# Patient Record
Sex: Female | Born: 1965 | Race: White | Hispanic: No | Marital: Married | Smoking: Former smoker
Health system: Southern US, Community
[De-identification: ages and names within clinical notes are randomized; demographics above are authoritative.]

## PROBLEM LIST (undated history)

## (undated) DIAGNOSIS — C50919 Malignant neoplasm of unspecified site of unspecified female breast: Secondary | ICD-10-CM

## (undated) DIAGNOSIS — Z923 Personal history of irradiation: Secondary | ICD-10-CM

## (undated) DIAGNOSIS — Z87891 Personal history of nicotine dependence: Secondary | ICD-10-CM

## (undated) DIAGNOSIS — M5126 Other intervertebral disc displacement, lumbar region: Secondary | ICD-10-CM

## (undated) DIAGNOSIS — I10 Essential (primary) hypertension: Secondary | ICD-10-CM

## (undated) DIAGNOSIS — K219 Gastro-esophageal reflux disease without esophagitis: Secondary | ICD-10-CM

## (undated) DIAGNOSIS — R000 Tachycardia, unspecified: Secondary | ICD-10-CM

## (undated) DIAGNOSIS — E785 Hyperlipidemia, unspecified: Secondary | ICD-10-CM

## (undated) DIAGNOSIS — Z8739 Personal history of other diseases of the musculoskeletal system and connective tissue: Secondary | ICD-10-CM

## (undated) DIAGNOSIS — Z9221 Personal history of antineoplastic chemotherapy: Secondary | ICD-10-CM

## (undated) DIAGNOSIS — Z1371 Encounter for nonprocreative screening for genetic disease carrier status: Secondary | ICD-10-CM

## (undated) DIAGNOSIS — G43909 Migraine, unspecified, not intractable, without status migrainosus: Secondary | ICD-10-CM

## (undated) DIAGNOSIS — Z1211 Encounter for screening for malignant neoplasm of colon: Secondary | ICD-10-CM

## (undated) HISTORY — PX: ABDOMINAL HYSTERECTOMY: SHX81

## (undated) HISTORY — PX: LAPAROSCOPIC TOTAL HYSTERECTOMY: SUR800

## (undated) HISTORY — DX: Malignant neoplasm of unspecified site of unspecified female breast: C50.919

## (undated) HISTORY — DX: Encounter for screening for malignant neoplasm of colon: Z12.11

## (undated) HISTORY — DX: Encounter for nonprocreative screening for genetic disease carrier status: Z13.71

## (undated) HISTORY — DX: Other intervertebral disc displacement, lumbar region: M51.26

## (undated) HISTORY — DX: Personal history of nicotine dependence: Z87.891

## (undated) HISTORY — DX: Essential (primary) hypertension: I10

## (undated) HISTORY — DX: Migraine, unspecified, not intractable, without status migrainosus: G43.909

## (undated) HISTORY — DX: Hyperlipidemia, unspecified: E78.5

## (undated) HISTORY — DX: Gastro-esophageal reflux disease without esophagitis: K21.9

## (undated) HISTORY — DX: Personal history of other diseases of the musculoskeletal system and connective tissue: Z87.39

## (undated) HISTORY — DX: Tachycardia, unspecified: R00.0

## (undated) HISTORY — PX: TONSILLECTOMY: SHX5217

---

## 1998-03-09 ENCOUNTER — Other Ambulatory Visit: Admission: RE | Admit: 1998-03-09 | Discharge: 1998-03-09 | Payer: Self-pay | Admitting: *Deleted

## 2005-01-13 DIAGNOSIS — C50919 Malignant neoplasm of unspecified site of unspecified female breast: Secondary | ICD-10-CM

## 2005-01-13 HISTORY — DX: Malignant neoplasm of unspecified site of unspecified female breast: C50.919

## 2005-01-13 HISTORY — PX: BREAST BIOPSY: SHX20

## 2005-06-26 ENCOUNTER — Ambulatory Visit: Payer: Self-pay | Admitting: Oncology

## 2005-06-27 ENCOUNTER — Ambulatory Visit: Payer: Self-pay | Admitting: Oncology

## 2005-07-13 ENCOUNTER — Ambulatory Visit: Payer: Self-pay | Admitting: Oncology

## 2005-08-13 ENCOUNTER — Ambulatory Visit: Payer: Self-pay | Admitting: Oncology

## 2005-09-13 ENCOUNTER — Ambulatory Visit: Payer: Self-pay | Admitting: Oncology

## 2005-10-13 ENCOUNTER — Ambulatory Visit: Payer: Self-pay | Admitting: Oncology

## 2005-11-13 ENCOUNTER — Ambulatory Visit: Payer: Self-pay | Admitting: Oncology

## 2005-11-14 ENCOUNTER — Ambulatory Visit: Payer: Self-pay | Admitting: Oncology

## 2005-12-13 ENCOUNTER — Ambulatory Visit: Payer: Self-pay | Admitting: Oncology

## 2005-12-13 HISTORY — PX: MASTECTOMY: SHX3

## 2005-12-19 ENCOUNTER — Ambulatory Visit: Payer: Self-pay | Admitting: General Surgery

## 2006-01-13 ENCOUNTER — Ambulatory Visit: Payer: Self-pay | Admitting: Oncology

## 2006-02-13 ENCOUNTER — Ambulatory Visit: Payer: Self-pay | Admitting: Oncology

## 2006-02-25 ENCOUNTER — Ambulatory Visit: Payer: Self-pay | Admitting: General Surgery

## 2006-03-14 ENCOUNTER — Ambulatory Visit: Payer: Self-pay | Admitting: Oncology

## 2006-03-14 ENCOUNTER — Ambulatory Visit: Payer: Self-pay | Admitting: Radiation Oncology

## 2006-04-14 ENCOUNTER — Ambulatory Visit: Payer: Self-pay | Admitting: Unknown Physician Specialty

## 2006-04-14 ENCOUNTER — Ambulatory Visit: Payer: Self-pay | Admitting: Oncology

## 2006-04-14 ENCOUNTER — Ambulatory Visit: Payer: Self-pay | Admitting: Radiation Oncology

## 2006-04-21 ENCOUNTER — Ambulatory Visit: Payer: Self-pay | Admitting: Unknown Physician Specialty

## 2006-05-14 ENCOUNTER — Ambulatory Visit: Payer: Self-pay | Admitting: Radiation Oncology

## 2006-05-14 ENCOUNTER — Ambulatory Visit: Payer: Self-pay | Admitting: Oncology

## 2006-06-14 ENCOUNTER — Ambulatory Visit: Payer: Self-pay | Admitting: Radiation Oncology

## 2006-06-14 ENCOUNTER — Ambulatory Visit: Payer: Self-pay | Admitting: Oncology

## 2006-07-14 ENCOUNTER — Ambulatory Visit: Payer: Self-pay | Admitting: Oncology

## 2006-07-14 ENCOUNTER — Ambulatory Visit: Payer: Self-pay | Admitting: Radiation Oncology

## 2006-08-14 ENCOUNTER — Ambulatory Visit: Payer: Self-pay | Admitting: Oncology

## 2006-08-14 ENCOUNTER — Ambulatory Visit: Payer: Self-pay | Admitting: Radiation Oncology

## 2006-08-17 ENCOUNTER — Ambulatory Visit: Payer: Self-pay | Admitting: General Surgery

## 2006-09-14 ENCOUNTER — Ambulatory Visit: Payer: Self-pay | Admitting: Radiation Oncology

## 2006-09-14 ENCOUNTER — Ambulatory Visit: Payer: Self-pay | Admitting: Oncology

## 2006-10-14 ENCOUNTER — Ambulatory Visit: Payer: Self-pay | Admitting: Oncology

## 2006-11-02 ENCOUNTER — Ambulatory Visit: Payer: Self-pay | Admitting: Oncology

## 2006-11-14 ENCOUNTER — Ambulatory Visit: Payer: Self-pay | Admitting: Oncology

## 2007-01-14 ENCOUNTER — Ambulatory Visit: Payer: Self-pay | Admitting: Oncology

## 2007-01-14 HISTORY — PX: COLONOSCOPY: SHX174

## 2007-01-19 ENCOUNTER — Ambulatory Visit: Payer: Self-pay | Admitting: Oncology

## 2007-01-27 ENCOUNTER — Ambulatory Visit: Payer: Self-pay | Admitting: General Surgery

## 2007-02-14 ENCOUNTER — Ambulatory Visit: Payer: Self-pay | Admitting: Oncology

## 2007-03-14 ENCOUNTER — Ambulatory Visit: Payer: Self-pay | Admitting: Oncology

## 2007-04-14 ENCOUNTER — Ambulatory Visit: Payer: Self-pay | Admitting: Oncology

## 2007-05-13 ENCOUNTER — Ambulatory Visit: Payer: Self-pay | Admitting: Oncology

## 2007-05-14 ENCOUNTER — Ambulatory Visit: Payer: Self-pay | Admitting: Oncology

## 2007-07-14 ENCOUNTER — Ambulatory Visit: Payer: Self-pay | Admitting: Oncology

## 2007-08-05 ENCOUNTER — Ambulatory Visit: Payer: Self-pay | Admitting: Oncology

## 2007-08-14 ENCOUNTER — Ambulatory Visit: Payer: Self-pay | Admitting: Oncology

## 2007-08-17 ENCOUNTER — Encounter: Payer: Self-pay | Admitting: Oncology

## 2007-09-14 ENCOUNTER — Ambulatory Visit: Payer: Self-pay | Admitting: Oncology

## 2007-09-14 ENCOUNTER — Encounter: Payer: Self-pay | Admitting: Oncology

## 2007-10-14 ENCOUNTER — Encounter: Payer: Self-pay | Admitting: Oncology

## 2007-11-05 ENCOUNTER — Ambulatory Visit: Payer: Self-pay | Admitting: Oncology

## 2007-11-14 ENCOUNTER — Ambulatory Visit: Payer: Self-pay | Admitting: Oncology

## 2007-11-14 ENCOUNTER — Encounter: Payer: Self-pay | Admitting: Oncology

## 2007-12-14 ENCOUNTER — Ambulatory Visit: Payer: Self-pay | Admitting: Oncology

## 2008-01-10 ENCOUNTER — Ambulatory Visit: Payer: Self-pay | Admitting: Unknown Physician Specialty

## 2008-02-14 ENCOUNTER — Ambulatory Visit: Payer: Self-pay | Admitting: Oncology

## 2008-02-17 ENCOUNTER — Ambulatory Visit: Payer: Self-pay | Admitting: Oncology

## 2008-02-24 ENCOUNTER — Ambulatory Visit: Payer: Self-pay | Admitting: Oncology

## 2008-03-13 ENCOUNTER — Ambulatory Visit: Payer: Self-pay | Admitting: Oncology

## 2008-04-13 ENCOUNTER — Ambulatory Visit: Payer: Self-pay | Admitting: Oncology

## 2008-05-13 ENCOUNTER — Ambulatory Visit: Payer: Self-pay | Admitting: Oncology

## 2008-06-08 ENCOUNTER — Ambulatory Visit: Payer: Self-pay | Admitting: Oncology

## 2008-06-13 ENCOUNTER — Ambulatory Visit: Payer: Self-pay | Admitting: Oncology

## 2008-08-17 ENCOUNTER — Ambulatory Visit: Payer: Self-pay | Admitting: General Surgery

## 2008-09-13 ENCOUNTER — Ambulatory Visit: Payer: Self-pay | Admitting: Oncology

## 2008-10-10 ENCOUNTER — Ambulatory Visit: Payer: Self-pay | Admitting: Oncology

## 2008-10-13 ENCOUNTER — Ambulatory Visit: Payer: Self-pay | Admitting: Oncology

## 2008-12-13 ENCOUNTER — Ambulatory Visit: Payer: Self-pay | Admitting: Oncology

## 2008-12-27 ENCOUNTER — Ambulatory Visit: Payer: Self-pay | Admitting: Oncology

## 2009-01-13 ENCOUNTER — Ambulatory Visit: Payer: Self-pay | Admitting: Oncology

## 2009-01-13 DIAGNOSIS — I10 Essential (primary) hypertension: Secondary | ICD-10-CM

## 2009-01-13 HISTORY — DX: Essential (primary) hypertension: I10

## 2009-03-13 ENCOUNTER — Ambulatory Visit: Payer: Self-pay | Admitting: Oncology

## 2009-03-23 ENCOUNTER — Ambulatory Visit: Payer: Self-pay | Admitting: Oncology

## 2009-04-13 ENCOUNTER — Ambulatory Visit: Payer: Self-pay | Admitting: Oncology

## 2009-06-13 ENCOUNTER — Ambulatory Visit: Payer: Self-pay | Admitting: Oncology

## 2009-06-18 ENCOUNTER — Ambulatory Visit: Payer: Self-pay | Admitting: Oncology

## 2009-07-02 ENCOUNTER — Emergency Department: Payer: Self-pay | Admitting: Emergency Medicine

## 2009-07-13 ENCOUNTER — Ambulatory Visit: Payer: Self-pay | Admitting: Oncology

## 2009-08-30 ENCOUNTER — Ambulatory Visit: Payer: Self-pay | Admitting: General Surgery

## 2009-09-11 ENCOUNTER — Ambulatory Visit: Payer: Self-pay | Admitting: Oncology

## 2009-09-12 LAB — CANCER ANTIGEN 27.29: CA 27.29: 6.5 U/mL (ref 0.0–38.6)

## 2009-09-13 ENCOUNTER — Ambulatory Visit: Payer: Self-pay | Admitting: Oncology

## 2009-10-13 ENCOUNTER — Ambulatory Visit: Payer: Self-pay | Admitting: Oncology

## 2009-12-13 ENCOUNTER — Ambulatory Visit: Payer: Self-pay | Admitting: Oncology

## 2009-12-28 ENCOUNTER — Encounter
Admission: RE | Admit: 2009-12-28 | Discharge: 2009-12-28 | Payer: Self-pay | Source: Home / Self Care | Attending: Oncology | Admitting: Oncology

## 2010-01-13 ENCOUNTER — Ambulatory Visit: Payer: Self-pay | Admitting: Oncology

## 2010-01-13 DIAGNOSIS — K219 Gastro-esophageal reflux disease without esophagitis: Secondary | ICD-10-CM

## 2010-01-13 DIAGNOSIS — Z8739 Personal history of other diseases of the musculoskeletal system and connective tissue: Secondary | ICD-10-CM

## 2010-01-13 HISTORY — DX: Personal history of other diseases of the musculoskeletal system and connective tissue: Z87.39

## 2010-01-13 HISTORY — DX: Gastro-esophageal reflux disease without esophagitis: K21.9

## 2010-03-12 ENCOUNTER — Ambulatory Visit: Payer: Self-pay | Admitting: Oncology

## 2010-03-14 ENCOUNTER — Ambulatory Visit: Payer: Self-pay | Admitting: Oncology

## 2010-04-14 ENCOUNTER — Ambulatory Visit: Payer: Self-pay | Admitting: Oncology

## 2010-04-25 ENCOUNTER — Ambulatory Visit: Payer: Self-pay | Admitting: Cardiovascular Disease

## 2010-07-10 ENCOUNTER — Encounter: Payer: Self-pay | Admitting: Oncology

## 2010-07-14 ENCOUNTER — Encounter: Payer: Self-pay | Admitting: Oncology

## 2010-08-14 ENCOUNTER — Encounter: Payer: Self-pay | Admitting: Oncology

## 2010-08-19 ENCOUNTER — Ambulatory Visit: Payer: Self-pay | Admitting: Oncology

## 2010-09-10 ENCOUNTER — Ambulatory Visit: Payer: Self-pay | Admitting: General Surgery

## 2010-09-14 ENCOUNTER — Ambulatory Visit: Payer: Self-pay | Admitting: Oncology

## 2010-10-14 ENCOUNTER — Ambulatory Visit: Payer: Self-pay | Admitting: Oncology

## 2010-11-14 ENCOUNTER — Ambulatory Visit: Payer: Self-pay | Admitting: Oncology

## 2010-12-14 ENCOUNTER — Ambulatory Visit: Payer: Self-pay | Admitting: Oncology

## 2011-02-17 ENCOUNTER — Ambulatory Visit: Payer: Self-pay | Admitting: Oncology

## 2011-02-17 LAB — COMPREHENSIVE METABOLIC PANEL
Albumin: 4.3 g/dL (ref 3.4–5.0)
Anion Gap: 6 — ABNORMAL LOW (ref 7–16)
BUN: 9 mg/dL (ref 7–18)
Bilirubin,Total: 0.3 mg/dL (ref 0.2–1.0)
Glucose: 90 mg/dL (ref 65–99)
Osmolality: 276 (ref 275–301)
Potassium: 4.5 mmol/L (ref 3.5–5.1)
Sodium: 139 mmol/L (ref 136–145)
Total Protein: 8.2 g/dL (ref 6.4–8.2)

## 2011-02-17 LAB — CBC CANCER CENTER
Basophil %: 0.4 %
Eosinophil #: 0.1 x10 3/mm (ref 0.0–0.7)
Lymphocyte %: 39.5 %
MCHC: 34.1 g/dL (ref 32.0–36.0)
Neutrophil %: 50.4 %

## 2011-03-14 ENCOUNTER — Ambulatory Visit: Payer: Self-pay | Admitting: Oncology

## 2011-04-03 ENCOUNTER — Ambulatory Visit (INDEPENDENT_AMBULATORY_CARE_PROVIDER_SITE_OTHER): Payer: Medicare Other | Admitting: Cardiovascular Disease

## 2011-04-03 ENCOUNTER — Encounter: Payer: Self-pay | Admitting: *Deleted

## 2011-04-03 ENCOUNTER — Encounter: Payer: Self-pay | Admitting: Cardiovascular Disease

## 2011-04-03 VITALS — BP 124/83 | HR 90 | Ht 66.0 in | Wt 154.0 lb

## 2011-04-03 DIAGNOSIS — E7849 Other hyperlipidemia: Secondary | ICD-10-CM | POA: Insufficient documentation

## 2011-04-03 DIAGNOSIS — R002 Palpitations: Secondary | ICD-10-CM

## 2011-04-03 DIAGNOSIS — I10 Essential (primary) hypertension: Secondary | ICD-10-CM | POA: Insufficient documentation

## 2011-04-03 DIAGNOSIS — R000 Tachycardia, unspecified: Secondary | ICD-10-CM | POA: Insufficient documentation

## 2011-04-03 DIAGNOSIS — E785 Hyperlipidemia, unspecified: Secondary | ICD-10-CM

## 2011-04-03 NOTE — Assessment & Plan Note (Signed)
She does report episodes of palpitations and elevated heart rate. This is not regular it happens occasionally. We have given her samples of low-dose bystolic to take as needed for symptom relief.

## 2011-04-03 NOTE — Assessment & Plan Note (Signed)
By her report and in clinic today, blood pressure is well controlled. She is only on a very low dose amlodipine. She would like to discontinue the medication if possible. We have suggested she hold the medication and closely monitor her blood pressure. She'll call our office if her blood pressure increases. If it does rise, we could alternatively start low-dose beta blocker given her palpitations and tachycardia symptoms.

## 2011-04-03 NOTE — Patient Instructions (Signed)
You are doing well. Please hold the amlodipine Take the bystolic 5 mg as needed for palpitations.  Please call us if you have new issues that need to be addressed before your next appt.

## 2011-04-03 NOTE — Progress Notes (Signed)
Patient ID: Barbara Fleming, female    DOB: 10-13-1965, 46 y.o.   MRN: [id number]  HPI Comments: Barbara Fleming is a pleasant 46 year old woman with history of breast cancer on the right, history of chemotherapy and radiation, reported hypertension, hyperlipidemia,  remote history of smoking for 10 years who stopped many years ago, presenting for second opinion regarding hypertension, palpitations and tachycardia. Notes indicate previous history of GERD  And low vitamin D  She reports that she was initially diagnosed with hypertension after an episode of neurologic type symptoms following a chiropractic adjustment several years ago. Blood pressure was 190 systolic and she presented to the emergency room and was started on 2 medications. Since then her blood pressure medications have been weaned and she takes amlodipine 2.5 mg daily. She reports her blood pressure has been well-controlled, typically in the 120 range systolic. She denies any significant chest pain or shortness of breath.   She has noticed slightly higher heart rate since her chemotherapy and radiation. More symptoms of palpitations in general. Though she does report also rates in the 70s sometimes before she goes to bed.  She has had problems in the past with TMJ. She currently sees Dr. Deloria Lair, chiropractor in [place] for TMJ and chronic neck and back discomfort.  Lab work from 2011 shows total cholesterol 205, LDL 134, HDL 59 Stress test August 2012 shows normal LV function on echocardiogram at stress and rest, ejection fraction 61% Echo April 2012 is essentially normal  EKG shows normal sinus rhythm with rate 94 beats a minute with no significant ST or T wave changes        Outpatient Encounter Prescriptions as of 04/03/2011  Medication Sig Dispense Refill  . amLODipine (NORVASC) 5 MG tablet Take 2.5 mg by mouth. 1-2 times daily as directed by doctor       . cyclobenzaprine (FLEXERIL) 5 MG tablet Take 1-2 tablets daily by  mouth three times a day as needed      . tamoxifen (NOLVADEX) 20 MG tablet Take 20 mg by mouth daily.      Marland Kitchen DISCONTD: citalopram (CELEXA) 10 MG tablet Take 10 mg by mouth daily.        Review of Systems  Constitutional: Negative.   HENT: Negative.   Eyes: Negative.   Respiratory: Negative.   Cardiovascular: Positive for palpitations.  Gastrointestinal: Negative.   Musculoskeletal: Negative.   Skin: Negative.   Neurological: Negative.   Hematological: Negative.   Psychiatric/Behavioral: Negative.   All other systems reviewed and are negative.    BP 124/83  Pulse 90  Ht 5\' 6"  (1.676 m)  Wt 154 lb (69.854 kg)  BMI 24.86 kg/m2  Physical Exam  Nursing note and vitals reviewed. Constitutional: She is oriented to person, place, and time. She appears well-developed and well-nourished.  HENT:  Head: Normocephalic.  Nose: Nose normal.  Mouth/Throat: Oropharynx is clear and moist.  Eyes: Conjunctivae are normal. Pupils are equal, round, and reactive to light.  Neck: Normal range of motion. Neck supple. No JVD present.  Cardiovascular: Normal rate, regular rhythm, S1 normal, S2 normal, normal heart sounds and intact distal pulses.  Exam reveals no gallop and no friction rub.   No murmur heard. Pulmonary/Chest: Effort normal and breath sounds normal. No respiratory distress. She has no wheezes. She has no rales. She exhibits no tenderness.  Abdominal: Soft. Bowel sounds are normal. She exhibits no distension. There is no tenderness.  Musculoskeletal: Normal range of motion. She exhibits no edema  and no tenderness.  Lymphadenopathy:    She has no cervical adenopathy.  Neurological: She is alert and oriented to person, place, and time. Coordination normal.  Skin: Skin is warm and dry. No rash noted. No erythema.  Psychiatric: She has a normal mood and affect. Her behavior is normal. Judgment and thought content normal.         Assessment and Plan

## 2011-04-03 NOTE — Assessment & Plan Note (Signed)
Cholesterol is mildly elevated, likely genetic. We have suggested she could try red yeast rice she does not want a prescription for cholesterol medication. Otherwise we have suggested she closely monitor her diet and increase her exercise.

## 2011-04-14 ENCOUNTER — Ambulatory Visit: Payer: Self-pay | Admitting: Oncology

## 2011-04-21 ENCOUNTER — Ambulatory Visit: Payer: Self-pay | Admitting: Gastroenterology

## 2011-04-24 LAB — PATHOLOGY REPORT

## 2011-05-09 ENCOUNTER — Encounter: Payer: Self-pay | Admitting: Cardiovascular Disease

## 2011-07-22 ENCOUNTER — Ambulatory Visit: Payer: Self-pay | Admitting: Oncology

## 2011-07-22 LAB — CBC CANCER CENTER
Basophil %: 0.4 %
Eosinophil %: 3.7 %
HCT: 41 % (ref 35.0–47.0)
HGB: 13.4 g/dL (ref 12.0–16.0)
Lymphocyte #: 1.7 x10 3/mm (ref 1.0–3.6)
MCHC: 32.7 g/dL (ref 32.0–36.0)
Monocyte #: 0.4 x10 3/mm (ref 0.2–0.9)
Neutrophil #: 2.4 x10 3/mm (ref 1.4–6.5)
WBC: 4.7 x10 3/mm (ref 3.6–11.0)

## 2011-07-22 LAB — COMPREHENSIVE METABOLIC PANEL
Albumin: 4 g/dL (ref 3.4–5.0)
Alkaline Phosphatase: 28 U/L — ABNORMAL LOW (ref 50–136)
BUN: 12 mg/dL (ref 7–18)
Bilirubin,Total: 0.3 mg/dL (ref 0.2–1.0)
Co2: 28 mmol/L (ref 21–32)
Creatinine: 0.88 mg/dL (ref 0.60–1.30)
Osmolality: 275 (ref 275–301)
SGPT (ALT): 31 U/L
Sodium: 138 mmol/L (ref 136–145)
Total Protein: 7.5 g/dL (ref 6.4–8.2)

## 2011-07-23 LAB — CANCER ANTIGEN 27.29: CA 27.29: 15.5 U/mL (ref 0.0–38.6)

## 2011-08-14 ENCOUNTER — Ambulatory Visit: Payer: Self-pay | Admitting: Oncology

## 2011-08-15 ENCOUNTER — Other Ambulatory Visit: Payer: Self-pay | Admitting: Cardiovascular Disease

## 2011-08-15 MED ORDER — NEBIVOLOL HCL 5 MG PO TABS: 5.0000 mg | ORAL_TABLET | Freq: Every day | ORAL | Status: DC

## 2011-08-15 NOTE — Telephone Encounter (Signed)
Per patient she was given bystolic 5mg  samples to try, patient would like prescription called into [facility] on [address].

## 2011-08-15 NOTE — Telephone Encounter (Signed)
Refilled Bystolic

## 2011-10-07 ENCOUNTER — Ambulatory Visit: Payer: Self-pay | Admitting: General Surgery

## 2011-10-29 ENCOUNTER — Ambulatory Visit: Payer: Self-pay | Admitting: Oncology

## 2011-10-30 LAB — CANCER ANTIGEN 27.29: CA 27.29: 21 U/mL (ref 0.0–38.6)

## 2011-11-14 ENCOUNTER — Ambulatory Visit: Payer: Self-pay | Admitting: Oncology

## 2012-03-13 ENCOUNTER — Ambulatory Visit: Payer: Self-pay | Admitting: Oncology

## 2012-03-31 LAB — COMPREHENSIVE METABOLIC PANEL
Albumin: 4.2 g/dL (ref 3.4–5.0)
BUN: 14 mg/dL (ref 7–18)
Calcium, Total: 9 mg/dL (ref 8.5–10.1)
Chloride: 99 mmol/L (ref 98–107)
Co2: 30 mmol/L (ref 21–32)
Creatinine: 0.9 mg/dL (ref 0.60–1.30)
EGFR (Non-African Amer.): >60
Osmolality: 276 (ref 275–301)
Potassium: 4.2 mmol/L (ref 3.5–5.1)
SGOT(AST): 24 U/L (ref 15–37)
SGPT (ALT): 39 U/L (ref 12–78)

## 2012-03-31 LAB — CBC CANCER CENTER
Basophil #: 0 x10 3/mm (ref 0.0–0.1)
Basophil %: 0.5 %
Eosinophil #: 0.1 x10 3/mm (ref 0.0–0.7)
Eosinophil %: 2.6 %
HGB: 13.8 g/dL (ref 12.0–16.0)
Lymphocyte #: 1.8 x10 3/mm (ref 1.0–3.6)
MCH: 30.4 pg (ref 26.0–34.0)
MCHC: 33.6 g/dL (ref 32.0–36.0)
MCV: 91 fL (ref 80–100)
Monocyte #: 0.4 x10 3/mm (ref 0.2–0.9)
RBC: 4.55 10*6/uL (ref 3.80–5.20)
RDW: 13.6 % (ref 11.5–14.5)
WBC: 4.8 x10 3/mm (ref 3.6–11.0)

## 2012-04-12 ENCOUNTER — Encounter: Payer: Self-pay | Admitting: Oncology

## 2012-04-13 ENCOUNTER — Ambulatory Visit: Payer: Self-pay | Admitting: Oncology

## 2012-04-23 ENCOUNTER — Emergency Department: Payer: Self-pay | Admitting: Emergency Medicine

## 2012-04-23 LAB — COMPREHENSIVE METABOLIC PANEL
Alkaline Phosphatase: 40 U/L — ABNORMAL LOW (ref 50–136)
BUN: 14 mg/dL (ref 7–18)
Chloride: 104 mmol/L (ref 98–107)
Co2: 29 mmol/L (ref 21–32)
EGFR (Non-African Amer.): >60
Potassium: 3.9 mmol/L (ref 3.5–5.1)
SGOT(AST): 29 U/L (ref 15–37)
SGPT (ALT): 34 U/L (ref 12–78)
Sodium: 137 mmol/L (ref 136–145)
Total Protein: 7.8 g/dL (ref 6.4–8.2)

## 2012-04-23 LAB — URINALYSIS, COMPLETE
Bilirubin,UR: NEGATIVE
Blood: NEGATIVE
Glucose,UR: NEGATIVE mg/dL (ref 0–75)
Hyaline Cast: 11
Ph: 7 (ref 4.5–8.0)
Specific Gravity: 1.02 (ref 1.003–1.030)
Squamous Epithelial: 1

## 2012-04-23 LAB — CBC
HCT: 41.2 % (ref 35.0–47.0)
HGB: 13.7 g/dL (ref 12.0–16.0)
MCH: 30.2 pg (ref 26.0–34.0)
MCHC: 33.2 g/dL (ref 32.0–36.0)
RBC: 4.52 10*6/uL (ref 3.80–5.20)

## 2012-04-27 ENCOUNTER — Encounter: Payer: Self-pay | Admitting: Oncology

## 2012-05-11 ENCOUNTER — Encounter: Payer: Self-pay | Admitting: Cardiovascular Disease

## 2012-05-11 ENCOUNTER — Ambulatory Visit (INDEPENDENT_AMBULATORY_CARE_PROVIDER_SITE_OTHER): Payer: Medicare Other | Admitting: Cardiovascular Disease

## 2012-05-11 VITALS — BP 112/82 | HR 83 | Ht 66.5 in | Wt 163.2 lb

## 2012-05-11 DIAGNOSIS — I10 Essential (primary) hypertension: Secondary | ICD-10-CM

## 2012-05-11 DIAGNOSIS — R000 Tachycardia, unspecified: Secondary | ICD-10-CM

## 2012-05-11 DIAGNOSIS — R55 Syncope and collapse: Secondary | ICD-10-CM | POA: Insufficient documentation

## 2012-05-11 DIAGNOSIS — E785 Hyperlipidemia, unspecified: Secondary | ICD-10-CM

## 2012-05-11 NOTE — Assessment & Plan Note (Signed)
For the most part, by her account, blood pressure has been relatively well-controlled. She will continue to monitor this

## 2012-05-11 NOTE — Assessment & Plan Note (Signed)
Symptoms of tachycardia have seem to improve. She does have occasional palpitations likely ectopy. The symptoms get severe, would probably hold off on taking beta blockers daily.

## 2012-05-11 NOTE — Assessment & Plan Note (Signed)
Recent episode of syncope concerning for vasovagal etiology. She's had similar symptoms in the past commonly associated with abdominal pain. Pain can usually cause drop in blood pressure, heart rate or both. I suspect she has had a drop in her blood pressure. We have suggested that she not take beta blockers on a regular basis as this could drop her blood pressure even further. I suggested that she has TMJ massage, that she did this while supine. Stay hydrated, salt load if needed. No further workup at this time as she is otherwise asymptomatic.

## 2012-05-11 NOTE — Progress Notes (Signed)
Patient ID: Barbara Fleming, female    DOB: [birth year], 47 y.o.   MRN: [id number]  HPI Comments: Ms. Tigges is a pleasant 47 year old woman with history of breast cancer on the right, history of chemotherapy and radiation, reported hypertension, hyperlipidemia,  remote history of smoking for 10 years who stopped many years ago,  GERD  And low vitamin D. She presents for routine followup. Previous history of loss of consciousness after stomach pains. Chemotherapy in 2007 him a radiation at that time, with Herceptin.  She reports that her blood pressure has been well-controlled. She only takes low-dose beta blocker when her blood pressure climbs. Chair recent episode of syncope. Has been was massaging her jaw, possibly for TMJ. She got into the shower, developed abdominal tingling sensation. She had loss of consciousness in the shower with a very slow her cover he after being laid supine. She was evaluated in the emergency room. Given IV fluids and felt better. EKG showed normal sinus rhythm with incomplete right bundle branch block. Lab work was essentially normal, normal cardiac enzymes, UA negative, CT head normal.  She has had problems in the past with TMJ. She currently sees Dr. Deloria Lair, chiropractor in [place] for TMJ and chronic neck and back discomfort.  Lab work from 2011 shows total cholesterol 205, LDL 134, HDL 59 No recent lab work Stress test August 2012 shows normal LV function on echocardiogram at stress and rest, ejection fraction 61% Echo April 2012 is essentially normal  EKG shows normal sinus rhythm with rate 83 beats a minute with no significant ST or T wave changes        Outpatient Encounter Prescriptions as of 05/11/2012  Medication Sig Dispense Refill  . calcium carbonate (TUMS - DOSED IN MG ELEMENTAL CALCIUM) 500 MG chewable tablet Chew 1 tablet by mouth daily.      . Cholecalciferol (VITAMIN D3) 1000 UNIT/SPRAY LIQD Take by mouth.      . cyclobenzaprine  (FLEXERIL) 5 MG tablet Take 1-2 tablets daily by mouth three times a day as needed      . exemestane (AROMASIN) 25 MG tablet Take 25 mg by mouth at bedtime.       . nebivolol (BYSTOLIC) 5 MG tablet Take 5 mg by mouth daily as needed.      . [DISCONTINUED] nebivolol (BYSTOLIC) 5 MG tablet Take 1 tablet (5 mg total) by mouth daily.  30 tablet  5  . [DISCONTINUED] amLODipine (NORVASC) 5 MG tablet Take 2.5 mg by mouth. 1-2 times daily as directed by doctor       . [DISCONTINUED] tamoxifen (NOLVADEX) 20 MG tablet Take 20 mg by mouth daily.       No facility-administered encounter medications on file as of 05/11/2012.    Review of Systems  Constitutional: Negative.   HENT: Negative.   Eyes: Negative.   Respiratory: Negative.   Cardiovascular: Positive for palpitations.  Gastrointestinal: Negative.   Musculoskeletal: Negative.   Skin: Negative.   Neurological: Positive for syncope.  Psychiatric/Behavioral: Negative.   All other systems reviewed and are negative.    BP 112/82  Pulse 83  Ht 5' 6.5" (1.689 m)  Wt 163 lb 4 oz (74.05 kg)  BMI 25.96 kg/m2  Physical Exam  Nursing note and vitals reviewed. Constitutional: She is oriented to person, place, and time. She appears well-developed and well-nourished.  HENT:  Head: Normocephalic.  Nose: Nose normal.  Mouth/Throat: Oropharynx is clear and moist.  Eyes: Conjunctivae are normal. Pupils are equal, round,  and reactive to light.  Neck: Normal range of motion. Neck supple. No JVD present.  Cardiovascular: Normal rate, regular rhythm, S1 normal, S2 normal, normal heart sounds and intact distal pulses.  Exam reveals no gallop and no friction rub.   No murmur heard. Pulmonary/Chest: Effort normal and breath sounds normal. No respiratory distress. She has no wheezes. She has no rales. She exhibits no tenderness.  Abdominal: Soft. Bowel sounds are normal. She exhibits no distension. There is no tenderness.  Musculoskeletal: Normal range of  motion. She exhibits no edema and no tenderness.  Lymphadenopathy:    She has no cervical adenopathy.  Neurological: She is alert and oriented to person, place, and time. Coordination normal.  Skin: Skin is warm and dry. No rash noted. No erythema.  Psychiatric: She has a normal mood and affect. Her behavior is normal. Judgment and thought content normal.    Assessment and Plan

## 2012-05-11 NOTE — Assessment & Plan Note (Signed)
I suggested that she followup with Dr. Dayna Barker for routine blood work. Prior cholesterol several years ago was 205. We did discuss various medication options. These also include over-the-counter red yeast rice.

## 2012-05-11 NOTE — Patient Instructions (Addendum)
You are doing well. No medication changes were made.  Consider Red Yeast Rice for high cholesterol  Please call us if you have new issues that need to be addressed before your next appt.  Your physician wants you to follow-up in: 12 months.  You will receive a reminder letter in the mail two months in advance. If you don't receive a letter, please call our office to schedule the follow-up appointment.

## 2012-05-13 ENCOUNTER — Encounter: Payer: Self-pay | Admitting: Oncology

## 2012-06-13 ENCOUNTER — Encounter: Payer: Self-pay | Admitting: Oncology

## 2012-06-15 ENCOUNTER — Encounter: Payer: Self-pay | Admitting: *Deleted

## 2012-06-15 DIAGNOSIS — Z853 Personal history of malignant neoplasm of breast: Secondary | ICD-10-CM | POA: Insufficient documentation

## 2012-07-07 ENCOUNTER — Ambulatory Visit (INDEPENDENT_AMBULATORY_CARE_PROVIDER_SITE_OTHER): Payer: Medicare Other | Admitting: General Surgery

## 2012-07-07 ENCOUNTER — Encounter: Payer: Self-pay | Admitting: General Surgery

## 2012-07-07 VITALS — BP 132/80 | HR 88 | Resp 14 | Ht 66.0 in | Wt 164.0 lb

## 2012-07-07 DIAGNOSIS — M799 Soft tissue disorder, unspecified: Secondary | ICD-10-CM

## 2012-07-07 DIAGNOSIS — M7989 Other specified soft tissue disorders: Secondary | ICD-10-CM

## 2012-07-07 NOTE — Progress Notes (Signed)
Patient ID: Barbara Fleming, female   DOB: 05-16-1965, 47 y.o.   MRN: [id number]  Chief Complaint  Patient presents with  . Cyst    HPI Barbara Fleming is a 47 y.o. female here today for cyst on back under left shoulder blade.It has been getting bigger. Have been there for two years. Pt is s/p mastectomy for CA. And is a regular pt here. HPI  Past Medical History  Diagnosis Date  . Migraines   . HLD (hyperlipidemia)   . Tachycardia   . Breast cancer     chemotherapy  . HTN (hypertension) 2011  . History of TMJ syndrome 2012  . Personal history of tobacco use, presenting hazards to health   . Personal history of malignant neoplasm of breast 2007    right mastectomy  . Special screening for malignant neoplasms, colon   . GERD (gastroesophageal reflux disease) 2012    Past Surgical History  Procedure Laterality Date  . Mastectomy  12/2005    right  . Laparoscopic total hysterectomy    . Abdominal hysterectomy    . Colonoscopy  2009    [facility] Dr. Bluford Kaufmann  . Tonsillectomy      as a child  . Breast biopsy Right 2007    Family History  Problem Relation Age of Onset  . Adopted: Yes  . Cancer Other     breast, no relationship listed    Social History History  Substance Use Topics  . Smoking status: Former Smoker -- 1.50 packs/day for 10 years    Types: Cigarettes  . Smokeless tobacco: Never Used  . Alcohol Use: No    Allergies  Allergen Reactions  . 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 calcium carbonate (TUMS - DOSED IN MG ELEMENTAL CALCIUM) 500 MG chewable tablet Chew 1 tablet by mouth daily.      . Cholecalciferol (VITAMIN D3) 1000 UNIT/SPRAY LIQD Take by mouth.      . cyclobenzaprine (FLEXERIL) 5 MG tablet Take 1-2 tablets daily by mouth three times a day as needed      . exemestane (AROMASIN) 25 MG tablet Take 25 mg by mouth at bedtime.       Marland Kitchen NASONEX 50  MCG/ACT nasal spray Place 1 spray into the nose daily.      . nebivolol (BYSTOLIC) 5 MG tablet Take 5 mg by mouth daily as needed.       No current facility-administered medications for this visit.    Review of Systems Review of Systems  Constitutional: Negative.   Respiratory: Negative.   Cardiovascular: Negative.     Blood pressure 132/80, pulse 88, resp. rate 14, height 5\' 6"  (1.676 m), weight 164 lb (74.39 kg).  Physical Exam Physical Exam Medial to the right scapula there is 3/2 cm soft mobile mass. Ity is larger now than 2 yrs ago. Data Reviewed none  Assessment    Lipoma back     Plan    Patient to return and have mass removed.        Oluchi Pucci G 07/09/2012, 4:57 PM

## 2012-07-07 NOTE — Patient Instructions (Addendum)
Patient to return to have an mass removed. \

## 2012-07-09 ENCOUNTER — Encounter: Payer: Self-pay | Admitting: General Surgery

## 2012-07-13 ENCOUNTER — Encounter: Payer: Self-pay | Admitting: General Surgery

## 2012-07-13 ENCOUNTER — Ambulatory Visit (INDEPENDENT_AMBULATORY_CARE_PROVIDER_SITE_OTHER): Payer: Medicare Other | Admitting: General Surgery

## 2012-07-13 VITALS — BP 130/80 | HR 74 | Resp 12 | Ht 66.0 in | Wt 160.0 lb

## 2012-07-13 DIAGNOSIS — L989 Disorder of the skin and subcutaneous tissue, unspecified: Secondary | ICD-10-CM | POA: Insufficient documentation

## 2012-07-13 DIAGNOSIS — M799 Soft tissue disorder, unspecified: Secondary | ICD-10-CM

## 2012-07-13 DIAGNOSIS — M7989 Other specified soft tissue disorders: Secondary | ICD-10-CM

## 2012-07-13 NOTE — Progress Notes (Signed)
Patient ID: Barbara Fleming, female   DOB: [birth year], 47 y.o.   MRN: [id number]  Chief Complaint  Patient presents with  . Other    rash     HPI Barbara Fleming is a 47 y.o. female here today for a rash at the right mastectomy site . The rash began in the axilla yesterday.  HPI  Past Medical History  Diagnosis Date  . Migraines   . HLD (hyperlipidemia)   . Tachycardia   . Breast cancer     chemotherapy  . HTN (hypertension) 2011  . History of TMJ syndrome 2012  . Personal history of tobacco use, presenting hazards to health   . Personal history of malignant neoplasm of breast 2007    right mastectomy  . Special screening for malignant neoplasms, colon   . GERD (gastroesophageal reflux disease) 2012    Past Surgical History  Procedure Laterality Date  . Mastectomy  12/2005    right  . Laparoscopic total hysterectomy    . Abdominal hysterectomy    . Colonoscopy  2009    [facility] Dr. Bluford Kaufmann  . Tonsillectomy      as a child  . Breast biopsy Right 2007    Family History  Problem Relation Age of Onset  . Adopted: Yes  . Cancer Other     breast, no relationship listed    Social History History  Substance Use Topics  . Smoking status: Former Smoker -- 1.50 packs/day for 10 years    Types: Cigarettes  . Smokeless tobacco: Never Used  . Alcohol Use: No    Allergies  Allergen Reactions  . 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 calcium carbonate (TUMS - DOSED IN MG ELEMENTAL CALCIUM) 500 MG chewable tablet Chew 1 tablet by mouth daily.      . Cholecalciferol (VITAMIN D3) 1000 UNIT/SPRAY LIQD Take by mouth.      . cyclobenzaprine (FLEXERIL) 5 MG tablet Take 1-2 tablets daily by mouth three times a day as needed      . exemestane (AROMASIN) 25 MG tablet Take 25 mg by mouth at bedtime.       Marland Kitchen NASONEX 50 MCG/ACT nasal spray Place 1 spray into the nose daily.      .  nebivolol (BYSTOLIC) 5 MG tablet Take 5 mg by mouth daily as needed.       No current facility-administered medications for this visit.    Review of Systems Review of Systems  Constitutional: Negative.   HENT: Positive for facial swelling.   Respiratory: Negative.   Cardiovascular: Negative.     Blood pressure 130/80, pulse 74, resp. rate 12, height 5\' 6"  (1.676 m), weight 160 lb (72.576 kg).  Physical Exam Physical Exam  Around the mastectomy scar there are several 3-4 mm slightly raised red skin lesions. No skin erythema or induration.   Data Reviewed None  Assessment    Likely benign skin lesion     Plan    She is due for excision of lipoma next week, will assess the skin lesions again at that time.  If they are persistent will excise one for pathology.        Mikhail Hallenbeck G 07/14/2012, 6:24 PM

## 2012-07-13 NOTE — Patient Instructions (Addendum)
Recheck at time of lipoma excision next week.

## 2012-07-14 ENCOUNTER — Encounter: Payer: Self-pay | Admitting: General Surgery

## 2012-07-19 ENCOUNTER — Encounter: Payer: Self-pay | Admitting: General Surgery

## 2012-07-19 ENCOUNTER — Ambulatory Visit (INDEPENDENT_AMBULATORY_CARE_PROVIDER_SITE_OTHER): Payer: Medicare Other | Admitting: General Surgery

## 2012-07-19 VITALS — BP 138/78 | HR 80 | Resp 12 | Ht 66.0 in | Wt 161.0 lb

## 2012-07-19 DIAGNOSIS — D1779 Benign lipomatous neoplasm of other sites: Secondary | ICD-10-CM

## 2012-07-19 DIAGNOSIS — D171 Benign lipomatous neoplasm of skin and subcutaneous tissue of trunk: Secondary | ICD-10-CM

## 2012-07-19 NOTE — Patient Instructions (Addendum)
Wound check in 1 week. Remove outer dressing in 2-3 days.

## 2012-07-19 NOTE — Progress Notes (Signed)
Patient ID: Barbara Fleming, female   DOB: 08/01/1965, 47 y.o.   MRN: [id number]  Chief Complaint  Patient presents with  . Procedure    excsion lipoma    HPI Barbara Fleming is a 47 y.o. female here today for a planned excision of lipoma on back. The spotty rasn in right mastectomy site has resolved. HPI  Past Medical History  Diagnosis Date  . Migraines   . HLD (hyperlipidemia)   . Tachycardia   . Breast cancer     chemotherapy  . HTN (hypertension) 2011  . History of TMJ syndrome 2012  . Personal history of tobacco use, presenting hazards to health   . Personal history of malignant neoplasm of breast 2007    right mastectomy  . Special screening for malignant neoplasms, colon   . GERD (gastroesophageal reflux disease) 2012    Past Surgical History  Procedure Laterality Date  . Mastectomy  12/2005    right  . Laparoscopic total hysterectomy    . Abdominal hysterectomy    . Colonoscopy  2009    [facility] Dr. Bluford Kaufmann  . Tonsillectomy      as a child  . Breast biopsy Right 2007    Family History  Problem Relation Age of Onset  . Adopted: Yes  . Cancer Other     breast, no relationship listed    Social History History  Substance Use Topics  . Smoking status: Former Smoker -- 1.50 packs/day for 10 years    Types: Cigarettes  . Smokeless tobacco: Never Used  . Alcohol Use: No    Allergies  Allergen Reactions  . 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 calcium carbonate (TUMS - DOSED IN MG ELEMENTAL CALCIUM) 500 MG chewable tablet Chew 1 tablet by mouth daily.      . Cholecalciferol (VITAMIN D3) 1000 UNIT/SPRAY LIQD Take by mouth.      . cyclobenzaprine (FLEXERIL) 5 MG tablet Take 1-2 tablets daily by mouth three times a day as needed      . exemestane (AROMASIN) 25 MG tablet Take 25 mg by mouth at bedtime.       Marland Kitchen NASONEX 50 MCG/ACT nasal spray Place 1 spray  into the nose daily.      . nebivolol (BYSTOLIC) 5 MG tablet Take 5 mg by mouth daily as needed.       No current facility-administered medications for this visit.    Review of Systems Review of Systems  Constitutional: Negative.   Respiratory: Negative.   Cardiovascular: Negative.     Blood pressure 138/78, pulse 80, resp. rate 12, height 5\' 6"  (1.676 m), weight 161 lb (73.029 kg).  Physical Exam Physical Exam  Data Reviewed none  Assessment    Lipoma back     Plan    Advised on wound care. F/U as scheduled.      Procedure: Excision of lipoma from back Anesthetic: 10 mL of 1% Xylocaine mixed with 0.5% Marcaine Prep: ChloraPrep  A transverse skin incision was made over the palpable lipoma medial to the lower end of the scapula. In the deep subcutaneous tissue a 3 cm lobulated lipomatous mass was identified and excised full. Disposable cautery was used to control bleeding as also 3-0 Vicryl ligature. Subcutaneous tissue closed with 3-0 Vicryl. Skin closed with subcuticular 4-0 Vicryl. Dressing: Steri-Strips Telfa Tegaderm Procedure well tolerated and no immediate problems  encountered.  Chianne Byrns G 07/20/2012, 8:31 AM

## 2012-07-20 ENCOUNTER — Encounter: Payer: Self-pay | Admitting: General Surgery

## 2012-07-22 LAB — PATHOLOGY

## 2012-07-26 ENCOUNTER — Telehealth: Payer: Self-pay | Admitting: *Deleted

## 2012-07-26 NOTE — Telephone Encounter (Signed)
Message copied by Levada Schilling on Mon Jul 26, 2012  8:21 AM ------      Message from: Kieth Brightly      Created: Fri Jul 23, 2012  8:36 AM       Please let pt pt know the pathology was normal. ------

## 2012-07-26 NOTE — Telephone Encounter (Signed)
Patient has been notified as instructed. This patient verbalizes understanding. 

## 2012-09-27 ENCOUNTER — Ambulatory Visit: Payer: Self-pay | Admitting: Oncology

## 2012-09-27 LAB — CBC CANCER CENTER
Basophil #: 0 x10 3/mm (ref 0.0–0.1)
Basophil %: 0.4 %
Eosinophil %: 2.3 %
HCT: 43.9 % (ref 35.0–47.0)
Lymphocyte #: 1.6 x10 3/mm (ref 1.0–3.6)
MCHC: 33.5 g/dL (ref 32.0–36.0)
MCV: 91 fL (ref 80–100)
Monocyte #: 0.3 x10 3/mm (ref 0.2–0.9)
Monocyte %: 7.8 %
Neutrophil %: 50.9 %
Platelet: 246 x10 3/mm (ref 150–440)
RDW: 13.9 % (ref 11.5–14.5)
WBC: 4.1 x10 3/mm (ref 3.6–11.0)

## 2012-09-27 LAB — COMPREHENSIVE METABOLIC PANEL
Alkaline Phosphatase: 42 U/L — ABNORMAL LOW (ref 50–136)
Anion Gap: 10 (ref 7–16)
BUN: 11 mg/dL (ref 7–18)
Bilirubin,Total: 0.3 mg/dL (ref 0.2–1.0)
Calcium, Total: 9.9 mg/dL (ref 8.5–10.1)
Chloride: 99 mmol/L (ref 98–107)
Co2: 30 mmol/L (ref 21–32)
Creatinine: 0.78 mg/dL (ref 0.60–1.30)
EGFR (African American): >60
Potassium: 4.3 mmol/L (ref 3.5–5.1)
SGOT(AST): 20 U/L (ref 15–37)
SGPT (ALT): 31 U/L (ref 12–78)
Sodium: 139 mmol/L (ref 136–145)
Total Protein: 7.9 g/dL (ref 6.4–8.2)

## 2012-09-28 LAB — CANCER ANTIGEN 27.29: CA 27.29: 14.6 U/mL (ref 0.0–38.6)

## 2012-09-29 LAB — MAGNESIUM: Magnesium: 1.8 mg/dL

## 2012-10-13 ENCOUNTER — Ambulatory Visit: Payer: Self-pay | Admitting: General Surgery

## 2012-10-13 ENCOUNTER — Ambulatory Visit: Payer: Self-pay | Admitting: Oncology

## 2012-10-14 ENCOUNTER — Ambulatory Visit: Payer: Self-pay | Admitting: Oncology

## 2012-10-14 ENCOUNTER — Encounter: Payer: Self-pay | Admitting: General Surgery

## 2012-10-21 ENCOUNTER — Encounter: Payer: Self-pay | Admitting: General Surgery

## 2012-10-21 ENCOUNTER — Ambulatory Visit (INDEPENDENT_AMBULATORY_CARE_PROVIDER_SITE_OTHER): Payer: Medicare Other | Admitting: General Surgery

## 2012-10-21 VITALS — BP 100/72 | HR 72 | Resp 12 | Ht 66.0 in | Wt 161.0 lb

## 2012-10-21 DIAGNOSIS — Z853 Personal history of malignant neoplasm of breast: Secondary | ICD-10-CM

## 2012-10-21 NOTE — Patient Instructions (Signed)
Continue self breast exams. Call office for any new breast issues or concerns. 

## 2012-10-21 NOTE — Progress Notes (Signed)
Patient ID: Barbara Fleming, female   DOB: 11/29/1965, 47 y.o.   MRN: [id number]  Chief Complaint  Patient presents with  . Follow-up    mammogram    HPI Barbara Fleming is a 47 y.o. female.  who presents for her annual breast evaluation. The most recent left mammogram was done on 10-13-12.  Patient does perform regular self breast checks and gets regular mammograms done.  No new issues with the breast and she is wearing her lymphedema sleeve.  She is having some back issues such as pain but has seen by chiropractor.  Followed also by Dr. Doylene Canning at [facility].  HPI  Past Medical History  Diagnosis Date  . Migraines   . HLD (hyperlipidemia)   . Tachycardia   . HTN (hypertension) 2011  . History of TMJ syndrome 2012  . Personal history of tobacco use, presenting hazards to health   . Personal history of malignant neoplasm of breast 2007    right mastectomy  . Special screening for malignant neoplasms, colon   . GERD (gastroesophageal reflux disease) 2012  . Breast cancer     chemotherapy/radiation    Past Surgical History  Procedure Laterality Date  . Mastectomy Right 12/2005    chemotherapy/radiation  . Laparoscopic total hysterectomy    . Abdominal hysterectomy    . Colonoscopy  2009    [facility] Dr. Bluford Kaufmann  . Tonsillectomy      as a child  . Breast biopsy Right 2007    Family History  Problem Relation Age of Onset  . Adopted: Yes  . Cancer Other     breast, no relationship listed    Social History History  Substance Use Topics  . Smoking status: Former Smoker -- 1.50 packs/day for 10 years    Types: Cigarettes  . Smokeless tobacco: Never Used  . Alcohol Use: No    Allergies  Allergen Reactions  . Shrimp [Shellfish Allergy]     Allergy to shrimp not sure of all shellfish    Current Outpatient Prescriptions  Medication Sig Dispense Refill  . TiZANidine HCl (ZANAFLEX PO) Take 2 mg by mouth as needed.       . calcium carbonate (TUMS - DOSED IN MG ELEMENTAL  CALCIUM) 500 MG chewable tablet Chew 1 tablet by mouth daily.      . Cholecalciferol (VITAMIN D3) 1000 UNIT/SPRAY LIQD Take by mouth.      . cyclobenzaprine (FLEXERIL) 5 MG tablet Take 1-2 tablets daily by mouth three times a day as needed      . exemestane (AROMASIN) 25 MG tablet Take 25 mg by mouth at bedtime.       . montelukast (SINGULAIR) 10 MG tablet       . NASONEX 50 MCG/ACT nasal spray Place 1 spray into the nose daily.      . nebivolol (BYSTOLIC) 5 MG tablet Take 5 mg by mouth daily as needed.      . valACYclovir (VALTREX) 500 MG tablet        No current facility-administered medications for this visit.    Review of Systems Review of Systems  Constitutional: Negative.   Respiratory: Negative.   Cardiovascular: Negative.   Musculoskeletal: Positive for back pain.    Blood pressure 100/72, pulse 72, resp. rate 12, height 5\' 6"  (1.676 m), weight 161 lb (73.029 kg).  Physical Exam Physical Exam  Constitutional: She is oriented to person, place, and time. She appears well-developed and well-nourished.  Eyes: Conjunctivae are normal. No scleral  icterus.  Neck: Neck supple.  Cardiovascular:  Pulses:      Dorsalis pedis pulses are 2+ on the right side, and 2+ on the left side.       Posterior tibial pulses are 2+ on the right side, and 2+ on the left side.  No lower leg edema. No Edema in right arm. No VV and no calf tenderness.  Pulmonary/Chest: Left breast exhibits no inverted nipple, no mass, no nipple discharge, no skin change and no tenderness.  Right mastectomy site well healed and no sign of reoccurrence noted.  Abdominal: Soft. Bowel sounds are normal. There is no hepatosplenomegaly. There is no tenderness. No hernia.  Lymphadenopathy:    She has no cervical adenopathy.    She has no axillary adenopathy.  Neurological: She is alert and oriented to person, place, and time.  Skin: Skin is warm and dry.    Data Reviewed Left breast mammogram reviewed and  stable.  Assessment    Stable exam, no lymphedema noted in right arm. No sign of reoccurrence in right mastectomy site.    Plan    Patient to return in one year for left mammogram and office visit. She has been asked to bring her most recent back films by the office for review.       SANKAR,SEEPLAPUTHUR G 10/21/2012, 7:16 PM

## 2012-11-13 ENCOUNTER — Ambulatory Visit: Payer: Self-pay | Admitting: Oncology

## 2012-11-18 ENCOUNTER — Other Ambulatory Visit: Payer: Self-pay

## 2013-01-21 ENCOUNTER — Other Ambulatory Visit: Payer: Self-pay

## 2013-01-21 MED ORDER — NEBIVOLOL HCL 5 MG PO TABS: 5.0000 mg | ORAL_TABLET | Freq: Every day | ORAL | Status: DC | PRN

## 2013-03-23 ENCOUNTER — Ambulatory Visit: Payer: Self-pay | Admitting: Oncology

## 2013-03-24 LAB — COMPREHENSIVE METABOLIC PANEL
Albumin: 4.7 g/dL (ref 3.4–5.0)
Alkaline Phosphatase: 36 U/L — ABNORMAL LOW
Anion Gap: 8 (ref 7–16)
BUN: 12 mg/dL (ref 7–18)
Bilirubin,Total: 0.3 mg/dL (ref 0.2–1.0)
CREATININE: 0.83 mg/dL (ref 0.60–1.30)
Calcium, Total: 9.8 mg/dL (ref 8.5–10.1)
Chloride: 101 mmol/L (ref 98–107)
Co2: 32 mmol/L (ref 21–32)
EGFR (African American): >60
EGFR (Non-African Amer.): >60
GLUCOSE: 90 mg/dL (ref 65–99)
OSMOLALITY: 281 (ref 275–301)
POTASSIUM: 4.5 mmol/L (ref 3.5–5.1)
SGOT(AST): 27 U/L (ref 15–37)
SGPT (ALT): 46 U/L (ref 12–78)
SODIUM: 141 mmol/L (ref 136–145)
Total Protein: 8.2 g/dL (ref 6.4–8.2)

## 2013-03-24 LAB — CBC CANCER CENTER
BASOS ABS: 0 x10 3/mm (ref 0.0–0.1)
BASOS PCT: 0.6 %
EOS ABS: 0.1 x10 3/mm (ref 0.0–0.7)
Eosinophil %: 1.5 %
HCT: 46.5 % (ref 35.0–47.0)
HGB: 15.2 g/dL (ref 12.0–16.0)
LYMPHS ABS: 1.4 x10 3/mm (ref 1.0–3.6)
LYMPHS PCT: 29 %
MCH: 30.5 pg (ref 26.0–34.0)
MCHC: 32.7 g/dL (ref 32.0–36.0)
MCV: 93 fL (ref 80–100)
Monocyte #: 0.4 x10 3/mm (ref 0.2–0.9)
Monocyte %: 7.8 %
Neutrophil #: 2.9 x10 3/mm (ref 1.4–6.5)
Neutrophil %: 61.1 %
Platelet: 260 x10 3/mm (ref 150–440)
RBC: 4.99 10*6/uL (ref 3.80–5.20)
RDW: 13.5 % (ref 11.5–14.5)
WBC: 4.7 x10 3/mm (ref 3.6–11.0)

## 2013-03-24 LAB — MAGNESIUM: Magnesium: 2.1 mg/dL

## 2013-03-25 LAB — CANCER ANTIGEN 27.29: CA 27.29: 20.1 U/mL (ref 0.0–38.6)

## 2013-04-13 ENCOUNTER — Ambulatory Visit: Payer: Self-pay | Admitting: Oncology

## 2013-07-05 ENCOUNTER — Ambulatory Visit: Payer: Self-pay | Admitting: Oncology

## 2013-07-06 LAB — CBC CANCER CENTER
BASOS PCT: 0.6 %
Basophil #: 0 x10 3/mm (ref 0.0–0.1)
EOS ABS: 0.1 x10 3/mm (ref 0.0–0.7)
Eosinophil %: 1.7 %
HCT: 41.5 % (ref 35.0–47.0)
HGB: 13.8 g/dL (ref 12.0–16.0)
LYMPHS ABS: 1.5 x10 3/mm (ref 1.0–3.6)
Lymphocyte %: 40.4 %
MCH: 31.3 pg (ref 26.0–34.0)
MCHC: 33.3 g/dL (ref 32.0–36.0)
MCV: 94 fL (ref 80–100)
MONO ABS: 0.2 x10 3/mm (ref 0.2–0.9)
MONOS PCT: 6.2 %
NEUTROS PCT: 51.1 %
Neutrophil #: 2 x10 3/mm (ref 1.4–6.5)
PLATELETS: 222 x10 3/mm (ref 150–440)
RBC: 4.4 10*6/uL (ref 3.80–5.20)
RDW: 13.4 % (ref 11.5–14.5)
WBC: 3.8 x10 3/mm (ref 3.6–11.0)

## 2013-07-06 LAB — CANCER ANTIGEN 27.29: CA 27.29: 8.9 U/mL (ref 0.0–38.6)

## 2013-07-13 ENCOUNTER — Ambulatory Visit: Payer: Self-pay | Admitting: Oncology

## 2013-09-22 ENCOUNTER — Ambulatory Visit (INDEPENDENT_AMBULATORY_CARE_PROVIDER_SITE_OTHER): Payer: Medicare Other

## 2013-09-22 ENCOUNTER — Encounter: Payer: Self-pay | Admitting: Podiatry

## 2013-09-22 ENCOUNTER — Ambulatory Visit (INDEPENDENT_AMBULATORY_CARE_PROVIDER_SITE_OTHER): Payer: Medicare Other | Admitting: Podiatry

## 2013-09-22 VITALS — BP 140/83 | HR 79 | Resp 16 | Ht 67.0 in | Wt 160.0 lb

## 2013-09-22 DIAGNOSIS — M779 Enthesopathy, unspecified: Secondary | ICD-10-CM

## 2013-09-22 DIAGNOSIS — M7750 Other enthesopathy of unspecified foot: Secondary | ICD-10-CM

## 2013-09-22 DIAGNOSIS — M778 Other enthesopathies, not elsewhere classified: Secondary | ICD-10-CM

## 2013-09-22 NOTE — Progress Notes (Signed)
   Subjective:    Patient ID: Barbara Fleming, female    DOB: 20-Mar-1965, 48 y.o.   MRN: [id number]  HPI Comments: Both of my feet hurt. They hurt if ive been up on them. In the mornings are really bad. It hurts in the arch and under the toes. Ive had the pain for 6 - 9 months. The pain is getting worse. i dont do anything for my feet.  Foot Pain Associated symptoms include headaches.      Review of Systems  HENT: Positive for ear pain and sneezing.   Eyes: Positive for itching.  Musculoskeletal:       Joint pain Muscle pain   Allergic/Immunologic: Positive for food allergies.  Neurological: Positive for headaches.  All other systems reviewed and are negative.      Objective:   Physical Exam: I have reviewed her past medical history medications allergies surgeries social history and review of systems. Pulses are strongly palpable bilateral. Capillary fill time to digits one through 5 is immediate. Neurologic sensorium is intact per Semmes-Weinstein monofilament. Deep tendon reflexes are intact bilateral muscle strength is 5 over 5 dorsiflexors plantar flexors inverters everters all intrinsic musculature is intact. Orthopedic evaluation demonstrates pain on in range of motion of the second third and fourth metatarsophalangeal joints bilaterally with tenderness on palpation of the medial calcaneal tubercle of the left heel. Radiographic evaluation does demonstrate soft tissue increase in density at the plantar fascial calcaneal insertion sites bilaterally with an elongated second metatarsal bilaterally.        Assessment & Plan:  Assessment: Capsulitis second third and fourth metatarsophalangeal joints bilateral. Second metatarsophalangeal joint being the worst bilateral. Mild plantar fasciitis left foot. This may be associated with her chemotherapeutic medicine. She is a breast cancer survivor x7 years.  Plan: We discussed the etiology pathology conservative versus surgical  therapies at this point we performed a Kenalog injection around the second metatarsophalangeal joint bilaterally. She tolerated procedure well without complication. I also dispensed to carbon graphite insoles to help prevent range of motion of the second metatarsal. I will followup with her in one month

## 2013-09-23 ENCOUNTER — Encounter: Payer: Self-pay | Admitting: General Surgery

## 2013-10-06 ENCOUNTER — Ambulatory Visit: Payer: Self-pay | Admitting: Oncology

## 2013-10-06 LAB — COMPREHENSIVE METABOLIC PANEL
Albumin: 4.7 g/dL (ref 3.4–5.0)
Alkaline Phosphatase: 30 U/L — ABNORMAL LOW
Anion Gap: 5 — ABNORMAL LOW (ref 7–16)
BUN: 11 mg/dL (ref 7–18)
Bilirubin,Total: 0.4 mg/dL (ref 0.2–1.0)
CALCIUM: 8.9 mg/dL (ref 8.5–10.1)
CREATININE: 0.82 mg/dL (ref 0.60–1.30)
Chloride: 104 mmol/L (ref 98–107)
Co2: 30 mmol/L (ref 21–32)
EGFR (Non-African Amer.): >60
Glucose: 87 mg/dL (ref 65–99)
OSMOLALITY: 276 (ref 275–301)
Potassium: 4.5 mmol/L (ref 3.5–5.1)
SGOT(AST): 32 U/L (ref 15–37)
SGPT (ALT): 31 U/L
Sodium: 139 mmol/L (ref 136–145)
Total Protein: 8.3 g/dL — ABNORMAL HIGH (ref 6.4–8.2)

## 2013-10-06 LAB — CBC CANCER CENTER
BASOS ABS: 0 x10 3/mm (ref 0.0–0.1)
BASOS PCT: 0.8 %
EOS ABS: 0.1 x10 3/mm (ref 0.0–0.7)
Eosinophil %: 1.9 %
HCT: 46.1 % (ref 35.0–47.0)
HGB: 15.2 g/dL (ref 12.0–16.0)
Lymphocyte #: 1.8 x10 3/mm (ref 1.0–3.6)
Lymphocyte %: 38.9 %
MCH: 31.6 pg (ref 26.0–34.0)
MCHC: 33 g/dL (ref 32.0–36.0)
MCV: 96 fL (ref 80–100)
Monocyte #: 0.4 x10 3/mm (ref 0.2–0.9)
Monocyte %: 8.1 %
Neutrophil #: 2.3 x10 3/mm (ref 1.4–6.5)
Neutrophil %: 50.3 %
Platelet: 259 x10 3/mm (ref 150–440)
RBC: 4.8 10*6/uL (ref 3.80–5.20)
RDW: 13.1 % (ref 11.5–14.5)
WBC: 4.5 x10 3/mm (ref 3.6–11.0)

## 2013-10-08 LAB — CANCER ANTIGEN 27.29: CA 27.29: 11.3 U/mL (ref 0.0–38.6)

## 2013-10-13 ENCOUNTER — Ambulatory Visit: Payer: Self-pay | Admitting: Oncology

## 2013-10-17 ENCOUNTER — Ambulatory Visit: Payer: Self-pay | Admitting: Family Medicine

## 2013-10-17 LAB — COMPREHENSIVE METABOLIC PANEL
ALBUMIN: 4.5 g/dL (ref 3.4–5.0)
ANION GAP: 3 — AB (ref 7–16)
Alkaline Phosphatase: 25 U/L — ABNORMAL LOW
BUN: 14 mg/dL (ref 7–18)
Bilirubin,Total: 0.4 mg/dL (ref 0.2–1.0)
CREATININE: 0.8 mg/dL (ref 0.60–1.30)
Calcium, Total: 8.9 mg/dL (ref 8.5–10.1)
Chloride: 105 mmol/L (ref 98–107)
Co2: 32 mmol/L (ref 21–32)
Glucose: 89 mg/dL (ref 65–99)
OSMOLALITY: 279 (ref 275–301)
POTASSIUM: 4.3 mmol/L (ref 3.5–5.1)
SGOT(AST): 15 U/L (ref 15–37)
SGPT (ALT): 27 U/L
Sodium: 140 mmol/L (ref 136–145)
Total Protein: 7.7 g/dL (ref 6.4–8.2)

## 2013-10-17 LAB — LIPID PANEL
Cholesterol: 247 mg/dL — ABNORMAL HIGH (ref 0–200)
HDL Cholesterol: 69 mg/dL — ABNORMAL HIGH (ref 40–60)
LDL CHOLESTEROL, CALC: 162 mg/dL — AB (ref 0–100)
TRIGLYCERIDES: 79 mg/dL (ref 0–200)
VLDL Cholesterol, Calc: 16 mg/dL (ref 5–40)

## 2013-10-19 ENCOUNTER — Ambulatory Visit: Payer: Medicare Other | Admitting: Podiatry

## 2013-10-20 ENCOUNTER — Ambulatory Visit: Payer: Self-pay | Admitting: General Surgery

## 2013-10-24 ENCOUNTER — Encounter: Payer: Self-pay | Admitting: General Surgery

## 2013-10-27 ENCOUNTER — Ambulatory Visit: Payer: Medicare Other | Admitting: General Surgery

## 2013-10-31 ENCOUNTER — Ambulatory Visit (INDEPENDENT_AMBULATORY_CARE_PROVIDER_SITE_OTHER): Payer: Medicare Other | Admitting: General Surgery

## 2013-10-31 ENCOUNTER — Encounter: Payer: Self-pay | Admitting: General Surgery

## 2013-10-31 ENCOUNTER — Ambulatory Visit: Payer: Medicare Other | Admitting: Podiatry

## 2013-10-31 VITALS — BP 128/72 | HR 70 | Resp 14 | Ht 67.0 in | Wt 162.0 lb

## 2013-10-31 DIAGNOSIS — Z853 Personal history of malignant neoplasm of breast: Secondary | ICD-10-CM

## 2013-10-31 NOTE — Patient Instructions (Signed)
Patient to return in 1 year with left screening mammogram. Continue self breast exams. Call office for any new breast issues or concerns.

## 2013-10-31 NOTE — Progress Notes (Signed)
Patient ID: Barbara Fleming, female   DOB: July 07, 1965, 48 y.o.   MRN: [id number]  Chief Complaint  Patient presents with  . Follow-up    1 year left mammogram     HPI Barbara Fleming is a 48 y.o. female who presents for a breast cancer follow up.The most recent mammogram was done on 10/20/13. Patient does perform regular self breast checks and gets regular mammograms done.  No new problems at this time.    HPI  Past Medical History  Diagnosis Date  . Migraines   . HLD (hyperlipidemia)   . Tachycardia   . HTN (hypertension) 2011  . History of TMJ syndrome 2012  . Personal history of tobacco use, presenting hazards to health   . Special screening for malignant neoplasms, colon   . GERD (gastroesophageal reflux disease) 2012  . Breast cancer 2007    chemotherapy/radiation- right breast    Past Surgical History  Procedure Laterality Date  . Mastectomy Right 12/2005    chemotherapy/radiation  . Laparoscopic total hysterectomy    . Abdominal hysterectomy    . Colonoscopy  2009    [facility] Dr. Candace Cruise  . Tonsillectomy      as a child  . Breast biopsy Right 2007    Family History  Problem Relation Age of Onset  . Adopted: Yes  . Cancer Other     breast, no relationship listed    Social History History  Substance Use Topics  . Smoking status: Former Smoker -- 1.50 packs/day for 10 years    Types: Cigarettes  . Smokeless tobacco: Never Used  . Alcohol Use: No    Allergies  Allergen Reactions  . Shrimp [Shellfish Allergy] Itching    Eyes burn Allergy to shrimp not sure of all shellfish    Current Outpatient Prescriptions  Medication Sig Dispense Refill  . Cholecalciferol (VITAMIN D3) 1000 UNIT/SPRAY LIQD Take by mouth.      Marland Kitchen exemestane (AROMASIN) 25 MG tablet Take 25 mg by mouth at bedtime.       . methocarbamol (ROBAXIN) 500 MG tablet Take 500 mg by mouth 4 (four) times daily.      . montelukast (SINGULAIR) 10 MG tablet       . NASONEX 50 MCG/ACT nasal spray Place  1 spray into the nose daily.      . nebivolol (BYSTOLIC) 5 MG tablet Take 1 tablet (5 mg total) by mouth daily as needed.  30 tablet  6  . valACYclovir (VALTREX) 500 MG tablet        No current facility-administered medications for this visit.    Review of Systems Review of Systems  Constitutional: Negative.   Respiratory: Negative.   Cardiovascular: Negative.     Blood pressure 128/72, pulse 70, resp. rate 14, height 5\' 7"  (1.702 m), weight 162 lb (73.483 kg).  Physical Exam Physical Exam  Constitutional: She is oriented to person, place, and time. She appears well-developed and well-nourished.  Eyes: Conjunctivae are normal. No scleral icterus.  Neck: Neck supple. No thyromegaly present.  Cardiovascular: Normal rate, regular rhythm and normal heart sounds.   No murmur heard. Pulmonary/Chest: Effort normal and breath sounds normal. Left breast exhibits no inverted nipple, no mass, no nipple discharge, no skin change and no tenderness.  Well healed right mastectomy site. Free of any local reoccurrence.   Abdominal: Soft. Normal appearance and bowel sounds are normal. There is no hepatosplenomegaly. There is no tenderness. No hernia.  Lymphadenopathy:    She  has no cervical adenopathy.    She has no axillary adenopathy.  Neurological: She is alert and oriented to person, place, and time.  Skin: Skin is warm and Fleming.    Data Reviewed Left mammogram-stable  Assessment    95yrs post right mastectomy, chemo and radiation for advanced cancer. Pt continued on Aromasin     Plan    23yr f/u with left screeening mammogram.        SANKAR,SEEPLAPUTHUR G 11/01/2013, 7:42 AM

## 2013-11-01 ENCOUNTER — Encounter: Payer: Self-pay | Admitting: General Surgery

## 2013-11-14 ENCOUNTER — Encounter: Payer: Self-pay | Admitting: General Surgery

## 2014-04-10 ENCOUNTER — Ambulatory Visit
Admit: 2014-04-10 | Disposition: A | Discharge status: Home / Self Care | Payer: Self-pay | Attending: Oncology | Admitting: Oncology

## 2014-04-10 LAB — CBC CANCER CENTER
BASOS PCT: 0.4 %
Basophil #: 0 x10 3/mm (ref 0.0–0.1)
Eosinophil #: 0 x10 3/mm (ref 0.0–0.7)
Eosinophil %: 0.6 %
HCT: 41.9 % (ref 35.0–47.0)
HGB: 14.1 g/dL (ref 12.0–16.0)
LYMPHS ABS: 1.3 x10 3/mm (ref 1.0–3.6)
Lymphocyte %: 20.9 %
MCH: 30.8 pg (ref 26.0–34.0)
MCHC: 33.8 g/dL (ref 32.0–36.0)
MCV: 91 fL (ref 80–100)
MONO ABS: 0.3 x10 3/mm (ref 0.2–0.9)
Monocyte %: 5 %
NEUTROS PCT: 73.1 %
Neutrophil #: 4.5 x10 3/mm (ref 1.4–6.5)
Platelet: 242 x10 3/mm (ref 150–440)
RBC: 4.59 10*6/uL (ref 3.80–5.20)
RDW: 13.2 % (ref 11.5–14.5)
WBC: 6.2 x10 3/mm (ref 3.6–11.0)

## 2014-04-10 LAB — COMPREHENSIVE METABOLIC PANEL
ALT: 23 U/L
Albumin: 5.1 g/dL — ABNORMAL HIGH
Alkaline Phosphatase: 26 U/L — ABNORMAL LOW
Anion Gap: 3 — ABNORMAL LOW (ref 7–16)
BUN: 15 mg/dL
Bilirubin,Total: 0.7 mg/dL
CHLORIDE: 101 mmol/L
CO2: 29 mmol/L
Calcium, Total: 9.4 mg/dL
Creatinine: 0.62 mg/dL
EGFR (African American): >60
EGFR (Non-African Amer.): >60
GLUCOSE: 93 mg/dL
Potassium: 4 mmol/L
SGOT(AST): 23 U/L
Sodium: 133 mmol/L — ABNORMAL LOW
Total Protein: 7.5 g/dL

## 2014-04-11 LAB — CANCER ANTIGEN 27.29: CA 27.29: 16.4 U/mL (ref 0.0–38.6)

## 2014-04-14 ENCOUNTER — Ambulatory Visit
Admit: 2014-04-14 | Disposition: A | Discharge status: Home / Self Care | Payer: Self-pay | Attending: Oncology | Admitting: Oncology

## 2014-05-02 ENCOUNTER — Encounter
Admit: 2014-05-02 | Disposition: A | Discharge status: Home / Self Care | Payer: Self-pay | Attending: Oncology | Admitting: Oncology

## 2014-05-30 ENCOUNTER — Ambulatory Visit: Payer: Medicare Other

## 2014-07-19 ENCOUNTER — Ambulatory Visit: Payer: Medicare Other | Admitting: General Surgery

## 2014-08-28 ENCOUNTER — Other Ambulatory Visit: Payer: Self-pay

## 2014-08-28 DIAGNOSIS — Z1231 Encounter for screening mammogram for malignant neoplasm of breast: Secondary | ICD-10-CM

## 2014-09-01 ENCOUNTER — Other Ambulatory Visit: Payer: Self-pay | Admitting: *Deleted

## 2014-09-01 DIAGNOSIS — Z853 Personal history of malignant neoplasm of breast: Secondary | ICD-10-CM

## 2014-09-04 ENCOUNTER — Inpatient Hospital Stay: Payer: Medicare Other | Attending: Oncology

## 2014-09-04 DIAGNOSIS — Z87891 Personal history of nicotine dependence: Secondary | ICD-10-CM | POA: Diagnosis not present

## 2014-09-04 DIAGNOSIS — K219 Gastro-esophageal reflux disease without esophagitis: Secondary | ICD-10-CM | POA: Diagnosis not present

## 2014-09-04 DIAGNOSIS — Z170 Estrogen receptor positive status [ER+]: Secondary | ICD-10-CM | POA: Insufficient documentation

## 2014-09-04 DIAGNOSIS — Z853 Personal history of malignant neoplasm of breast: Secondary | ICD-10-CM

## 2014-09-04 DIAGNOSIS — I10 Essential (primary) hypertension: Secondary | ICD-10-CM | POA: Insufficient documentation

## 2014-09-04 DIAGNOSIS — Z9221 Personal history of antineoplastic chemotherapy: Secondary | ICD-10-CM | POA: Diagnosis not present

## 2014-09-04 LAB — CBC WITH DIFFERENTIAL/PLATELET
Basophils Absolute: 0 10*3/uL (ref 0–0.1)
Basophils Relative: 1 %
EOS ABS: 0.2 10*3/uL (ref 0–0.7)
EOS PCT: 5 %
HEMATOCRIT: 43.6 % (ref 35.0–47.0)
Hemoglobin: 14.8 g/dL (ref 12.0–16.0)
Lymphocytes Relative: 41 %
Lymphs Abs: 1.6 10*3/uL (ref 1.0–3.6)
MCH: 30.8 pg (ref 26.0–34.0)
MCHC: 34 g/dL (ref 32.0–36.0)
MCV: 90.7 fL (ref 80.0–100.0)
MONO ABS: 0.3 10*3/uL (ref 0.2–0.9)
MONOS PCT: 8 %
Neutro Abs: 1.8 10*3/uL (ref 1.4–6.5)
Neutrophils Relative %: 45 %
Platelets: 253 10*3/uL (ref 150–440)
RBC: 4.8 MIL/uL (ref 3.80–5.20)
RDW: 13.1 % (ref 11.5–14.5)
WBC: 3.9 10*3/uL (ref 3.6–11.0)

## 2014-09-04 LAB — COMPREHENSIVE METABOLIC PANEL
ALBUMIN: 5.1 g/dL — AB (ref 3.5–5.0)
ALT: 18 U/L (ref 14–54)
AST: 23 U/L (ref 15–41)
Alkaline Phosphatase: 23 U/L — ABNORMAL LOW (ref 38–126)
Anion gap: 8 (ref 5–15)
BILIRUBIN TOTAL: 0.7 mg/dL (ref 0.3–1.2)
BUN: 11 mg/dL (ref 6–20)
CO2: 29 mmol/L (ref 22–32)
Calcium: 9.4 mg/dL (ref 8.9–10.3)
Chloride: 101 mmol/L (ref 101–111)
Creatinine, Ser: 0.77 mg/dL (ref 0.44–1.00)
GFR calc Af Amer: >60 mL/min (ref >60)
GFR calc non Af Amer: >60 mL/min (ref >60)
GLUCOSE: 93 mg/dL (ref 65–99)
Potassium: 4.4 mmol/L (ref 3.5–5.1)
Sodium: 138 mmol/L (ref 135–145)
Total Protein: 7.9 g/dL (ref 6.5–8.1)

## 2014-09-05 LAB — CANCER ANTIGEN 27.29: CA 27.29: 24.1 U/mL (ref 0.0–38.6)

## 2014-09-11 ENCOUNTER — Inpatient Hospital Stay (HOSPITAL_BASED_OUTPATIENT_CLINIC_OR_DEPARTMENT_OTHER): Payer: Medicare Other | Admitting: Oncology

## 2014-09-11 VITALS — BP 122/85 | HR 70 | Temp 97.6°F | Wt 160.4 lb

## 2014-09-11 DIAGNOSIS — Z853 Personal history of malignant neoplasm of breast: Secondary | ICD-10-CM

## 2014-09-11 DIAGNOSIS — Z170 Estrogen receptor positive status [ER+]: Secondary | ICD-10-CM

## 2014-09-11 DIAGNOSIS — Z1231 Encounter for screening mammogram for malignant neoplasm of breast: Secondary | ICD-10-CM

## 2014-09-11 DIAGNOSIS — Z9221 Personal history of antineoplastic chemotherapy: Secondary | ICD-10-CM | POA: Diagnosis not present

## 2014-09-11 DIAGNOSIS — E785 Hyperlipidemia, unspecified: Secondary | ICD-10-CM

## 2014-09-11 NOTE — Progress Notes (Signed)
Patient does not have living will.  Former smoker.  Patient moved her 6 months appointment up a month due to a change in her health insurance.

## 2014-09-18 ENCOUNTER — Encounter: Payer: Self-pay | Admitting: Oncology

## 2014-09-18 DIAGNOSIS — M542 Cervicalgia: Secondary | ICD-10-CM | POA: Insufficient documentation

## 2014-09-18 DIAGNOSIS — C801 Malignant (primary) neoplasm, unspecified: Secondary | ICD-10-CM | POA: Insufficient documentation

## 2014-09-18 DIAGNOSIS — E782 Mixed hyperlipidemia: Secondary | ICD-10-CM | POA: Insufficient documentation

## 2014-09-18 DIAGNOSIS — I10 Essential (primary) hypertension: Secondary | ICD-10-CM | POA: Insufficient documentation

## 2014-09-18 DIAGNOSIS — K219 Gastro-esophageal reflux disease without esophagitis: Secondary | ICD-10-CM | POA: Insufficient documentation

## 2014-09-18 NOTE — Progress Notes (Signed)
Barbara Fleming @ [facility] [phone]  Fax:(336) [place]: [birth year]  MR#: [id number]  ZDG#:[id number]  Patient Care Team: Chad Cordial, PA-C as PCP - General (Physician Assistant) Christene Lye, MD (General Surgery)  CHIEF COMPLAINT:  Chief Complaint  Patient presents with  . Follow-up  Carcinom  a of right breast.   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 HER-2 positive 3+ by IHC. Status post chemotherapy with Cytoxan, Adriamycin, and Taxol, and Herceptin followed by mastectomy and radiation therapy.. 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  Came today further follow-up.  Getting regular mammograms done being managed by general surgeon.  No bony pain.  Appetite has been stable.  Patient continues to have muscle pain.  Worried about tumor markers which are still normal but show some rising trend.  Patient is also getting Zometa once a YEAR.  Getting dental checkup done on a regular basis REVIEW OF SYSTEMS:   GENERAL:  Feels good.  Active.  No fevers, sweats or weight loss. PERFORMANCE STATUS (ECOG):  0 HEENT:  No visual changes, runny nose, sore throat, mouth sores or tenderness. Lungs: No shortness of breath or cough.  No hemoptysis. Cardiac:  No chest pain, palpitations, orthopnea, or PND. GI:  No nausea, vomiting, diarrhea, constipation, melena or hematochezia. GU:  No urgency, frequency, dysuria, or hematuria. Musculoskeletal:  No back pain.  No joint pain.  No muscle tenderness. Extremities:  No pain or swelling. Skin:  No rashes or skin changes. Neuro:  No headache, numbness or weakness, balance or coordination issues. Endocrine:  No diabetes, thyroid issues, hot flashes or night sweats. Psych:  No mood changes, depression or anxiety. Pain:  No focal  pain. Review of systems:  All other systems reviewed and found to be negative. As per HPI. Otherwise, a complete review of systems is negatve.  PAST MEDICAL HISTORY: Past Medical History  Diagnosis Date  . Migraines   . HLD (hyperlipidemia)   . Tachycardia   . HTN (hypertension) 2011  . History of TMJ syndrome 2012  . Personal history of tobacco use, presenting hazards to health   . Special screening for malignant neoplasms, colon   . GERD (gastroesophageal reflux disease) 2012  . Breast cancer 2007    chemotherapy/radiation- right breast    PAST SURGICAL HISTORY: Past Surgical History  Procedure Laterality Date  . Mastectomy Right 12/2005    chemotherapy/radiation  . Laparoscopic total hysterectomy    . Abdominal hysterectomy    . Colonoscopy  2009    [facility] Dr. Candace Cruise  . Tonsillectomy      as a child  . Breast biopsy Right 2007    FAMILY HISTORY Family History  Problem Relation Age of Onset  . Adopted: Yes  . Cancer Other     breast, no relationship listed    ADVANCED DIRECTIVES:  No flowsheet data found.  HEALTH MAINTENANCE: Social History  Substance Use Topics  . Smoking status: Former Smoker -- 1.50 packs/day for 10 years    Types: Cigarettes  . Smokeless tobacco: Never Used  . Alcohol Use: No      Allergies  Allergen Reactions  . Shrimp [Shellfish Allergy] Itching    Eyes burn Allergy to shrimp not sure of all shellfish    Current Outpatient Prescriptions  Medication  Sig Dispense Refill  . Cholecalciferol (VITAMIN D3) 1000 UNIT/SPRAY LIQD Take by mouth.    . EPINEPHrine (EPIPEN 2-PAK) 0.3 mg/0.3 mL IJ SOAJ injection     . exemestane (AROMASIN) 25 MG tablet Take 25 mg by mouth at bedtime.     . methocarbamol (ROBAXIN) 500 MG tablet Take 500 mg by mouth 4 (four) times daily.    . montelukast (SINGULAIR) 10 MG tablet     . NASONEX 50 MCG/ACT nasal spray Place 1 spray into the nose daily.    . valACYclovir (VALTREX) 500 MG tablet     . nebivolol  (BYSTOLIC) 5 MG tablet Take 1 tablet (5 mg total) by mouth daily as needed. (Patient not taking: Reported on 09/11/2014) 30 tablet 6   No current facility-administered medications for this visit.    OBJECTIVE:  Filed Vitals:   09/11/14 1547  BP: 122/85  Pulse: 70  Temp: 97.6 F (36.4 C)     Body mass index is 25.11 kg/(m^2).    ECOG FS:0 - Asymptomatic  PHYSICAL EXAM: GENERAL:  Well developed, well nourished, sitting comfortably in the exam room in no acute distress. MENTAL STATUS:  Alert and oriented to person, place and time. . ENT:  Oropharynx clear without lesion.  Tongue normal. Mucous membranes moist.  RESPIRATORY:  Clear to auscultation without rales, wheezes or rhonchi. CARDIOVASCULAR:  Regular rate and rhythm without murmur, rub or gallop. BREAST:  Mastectomy on the right side.  Chest wall area and no evidence of recurrent disease..  Left breast without masses, skin changes or nipple discharge. ABDOMEN:  Soft, non-tender, with active bowel sounds, and no hepatosplenomegaly.  No masses. BACK:  No CVA tenderness.  No tenderness on percussion of the back or rib cage. SKIN:  No rashes, ulcers or lesions. EXTREMITIES: No edema, no skin discoloration or tenderness.  No palpable cords. LYMPH NODES: No palpable cervical, supraclavicular, axillary or inguinal adenopathy  NEUROLOGICAL: Unremarkable. PSYCH:  Appropriate.   LAB RESULTS:  CBC Latest Ref Rng 09/04/2014 04/10/2014  WBC 3.6 - 11.0 K/uL 3.9 6.2  Hemoglobin 12.0 - 16.0 g/dL 14.8 14.1  Hematocrit 35.0 - 47.0 % 43.6 41.9  Platelets 150 - 440 K/uL 253 242    No visits with results within 5 Day(s) from this visit. Latest known visit with results is:  Appointment on 09/04/2014  Component Date Value Ref Range Status  . WBC 09/04/2014 3.9  3.6 - 11.0 K/uL Final  . RBC 09/04/2014 4.80  3.80 - 5.20 MIL/uL Final  . Hemoglobin 09/04/2014 14.8  12.0 - 16.0 g/dL Final  . HCT 09/04/2014 43.6  35.0 - 47.0 % Final  . MCV  09/04/2014 90.7  80.0 - 100.0 fL Final  . MCH 09/04/2014 30.8  26.0 - 34.0 pg Final  . MCHC 09/04/2014 34.0  32.0 - 36.0 g/dL Final  . RDW 09/04/2014 13.1  11.5 - 14.5 % Final  . Platelets 09/04/2014 253  150 - 440 K/uL Final  . Neutrophils Relative % 09/04/2014 45   Final  . Neutro Abs 09/04/2014 1.8  1.4 - 6.5 K/uL Final  . Lymphocytes Relative 09/04/2014 41   Final  . Lymphs Abs 09/04/2014 1.6  1.0 - 3.6 K/uL Final  . Monocytes Relative 09/04/2014 8   Final  . Monocytes Absolute 09/04/2014 0.3  0.2 - 0.9 K/uL Final  . Eosinophils Relative 09/04/2014 5   Final  . Eosinophils Absolute 09/04/2014 0.2  0 - 0.7 K/uL Final  . Basophils Relative 09/04/2014 1  Final  . Basophils Absolute 09/04/2014 0.0  0 - 0.1 K/uL Final  . Sodium 09/04/2014 138  135 - 145 mmol/L Final  . Potassium 09/04/2014 4.4  3.5 - 5.1 mmol/L Final  . Chloride 09/04/2014 101  101 - 111 mmol/L Final  . CO2 09/04/2014 29  22 - 32 mmol/L Final  . Glucose, Bld 09/04/2014 93  65 - 99 mg/dL Final  . BUN 09/04/2014 11  6 - 20 mg/dL Final  . Creatinine, Ser 09/04/2014 0.77  0.44 - 1.00 mg/dL Final  . Calcium 09/04/2014 9.4  8.9 - 10.3 mg/dL Final  . Total Protein 09/04/2014 7.9  6.5 - 8.1 g/dL Final  . Albumin 09/04/2014 5.1* 3.5 - 5.0 g/dL Final  . AST 09/04/2014 23  15 - 41 U/L Final  . ALT 09/04/2014 18  14 - 54 U/L Final  . Alkaline Phosphatase 09/04/2014 23* 38 - 126 U/L Final  . Total Bilirubin 09/04/2014 0.7  0.3 - 1.2 mg/dL Final  . GFR calc non Af Amer 09/04/2014 >60  >60 mL/min Final  . GFR calc Af Amer 09/04/2014 >60  >60 mL/min Final   Comment: (NOTE) The eGFR has been calculated using the CKD EPI equation. This calculation has not been validated in all clinical situations. eGFR's persistently <60 mL/min signify possible Chronic Kidney Disease.   . Anion gap 09/04/2014 8  5 - 15 Final  . 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 She also understands that She can call clinic at any time with any questions, concerns, or complaints.    No matching staging information was found for the patient.  Forest Gleason, MD   09/18/2014 8:17 AM

## 2014-10-03 ENCOUNTER — Other Ambulatory Visit
Admission: RE | Admit: 2014-10-03 | Discharge: 2014-10-03 | Disposition: A | Discharge status: Home / Self Care | Payer: Medicare Other | Source: Ambulatory Visit | Attending: Obstetrics and Gynecology | Admitting: Obstetrics and Gynecology

## 2014-10-03 ENCOUNTER — Other Ambulatory Visit
Admission: RE | Admit: 2014-10-03 | Discharge: 2014-10-03 | Disposition: A | Discharge status: Home / Self Care | Payer: Medicare Other | Source: Ambulatory Visit | Attending: Allergy and Immunology | Admitting: Allergy and Immunology

## 2014-10-03 DIAGNOSIS — Z91013 Allergy to seafood: Secondary | ICD-10-CM | POA: Insufficient documentation

## 2014-10-03 LAB — LIPID PANEL
Cholesterol: 251 mg/dL — ABNORMAL HIGH (ref 0–200)
HDL: 60 mg/dL (ref >40)
LDL CALC: 179 mg/dL — AB (ref 0–99)
Total CHOL/HDL Ratio: 4.2 RATIO
Triglycerides: 60 mg/dL (ref <150)
VLDL: 12 mg/dL (ref 0–40)

## 2014-10-03 LAB — COMPREHENSIVE METABOLIC PANEL
ALBUMIN: 4.8 g/dL (ref 3.5–5.0)
ALK PHOS: 23 U/L — AB (ref 38–126)
ALT: 23 U/L (ref 14–54)
ANION GAP: 6 (ref 5–15)
AST: 24 U/L (ref 15–41)
BUN: 12 mg/dL (ref 6–20)
CALCIUM: 9.2 mg/dL (ref 8.9–10.3)
CHLORIDE: 104 mmol/L (ref 101–111)
CO2: 28 mmol/L (ref 22–32)
Creatinine, Ser: 0.66 mg/dL (ref 0.44–1.00)
GFR calc Af Amer: >60 mL/min (ref >60)
GFR calc non Af Amer: >60 mL/min (ref >60)
GLUCOSE: 93 mg/dL (ref 65–99)
Potassium: 3.9 mmol/L (ref 3.5–5.1)
SODIUM: 138 mmol/L (ref 135–145)
Total Bilirubin: 1 mg/dL (ref 0.3–1.2)
Total Protein: 7.2 g/dL (ref 6.5–8.1)

## 2014-10-04 LAB — VITAMIN D 25 HYDROXY (VIT D DEFICIENCY, FRACTURES): Vit D, 25-Hydroxy: 44.5 ng/mL (ref 30.0–100.0)

## 2014-10-10 ENCOUNTER — Other Ambulatory Visit: Payer: Medicare Other

## 2014-10-17 ENCOUNTER — Ambulatory Visit: Payer: Medicare Other | Admitting: Oncology

## 2014-10-23 ENCOUNTER — Ambulatory Visit: Payer: Medicare Other

## 2014-10-24 ENCOUNTER — Other Ambulatory Visit: Payer: Self-pay | Admitting: General Surgery

## 2014-10-24 ENCOUNTER — Ambulatory Visit
Admission: RE | Admit: 2014-10-24 | Discharge: 2014-10-24 | Disposition: A | Discharge status: Home / Self Care | Payer: Medicare Other | Source: Ambulatory Visit | Attending: General Surgery | Admitting: General Surgery

## 2014-10-24 DIAGNOSIS — Z1231 Encounter for screening mammogram for malignant neoplasm of breast: Secondary | ICD-10-CM | POA: Diagnosis not present

## 2014-10-31 ENCOUNTER — Ambulatory Visit: Payer: Medicare Other | Admitting: General Surgery

## 2014-10-31 ENCOUNTER — Ambulatory Visit (INDEPENDENT_AMBULATORY_CARE_PROVIDER_SITE_OTHER): Payer: Medicare Other | Admitting: General Surgery

## 2014-10-31 ENCOUNTER — Encounter: Payer: Self-pay | Admitting: General Surgery

## 2014-10-31 VITALS — BP 126/78 | HR 80 | Resp 12 | Ht 67.0 in | Wt 163.0 lb

## 2014-10-31 DIAGNOSIS — Z853 Personal history of malignant neoplasm of breast: Secondary | ICD-10-CM | POA: Diagnosis not present

## 2014-10-31 NOTE — Progress Notes (Signed)
Patient ID: Barbara Fleming, female   DOB: 11/04/1965, 48 y.o.   MRN: [id number]  Chief Complaint  Patient presents with  . Follow-up    left mammogram    HPI Barbara Fleming is a 48 y.o. female who presents for a breast cancer follow up. The most recent mammogram was done on 10/14/14.  Patient does perform regular self breast checks and gets regular mammograms done.  No new breast issues. She states she is moving to [place] next week.  HPI  Past Medical History  Diagnosis Date  . Migraines   . HLD (hyperlipidemia)   . Tachycardia   . HTN (hypertension) 2011  . History of TMJ syndrome 2012  . Personal history of tobacco use, presenting hazards to health   . Special screening for malignant neoplasms, colon   . GERD (gastroesophageal reflux disease) 2012  . Breast cancer (HCC) 2007    chemotherapy/radiation- right breast/BRCA and my risk negative    Past Surgical History  Procedure Laterality Date  . Mastectomy Right 12/2005    chemotherapy/radiation  . Laparoscopic total hysterectomy    . Abdominal hysterectomy    . Colonoscopy  2009    [facility] Dr. Oh  . Tonsillectomy      as a child  . Breast biopsy Right 2007    Family History  Problem Relation Age of Onset  . Adopted: Yes  . Cancer Other     breast, no relationship listed    Social History Social History  Substance Use Topics  . Smoking status: Former Smoker -- 1.50 packs/day for 10 years    Types: Cigarettes  . Smokeless tobacco: Never Used  . Alcohol Use: No    Allergies  Allergen Reactions  . Shrimp [Shellfish Allergy] Itching    Eyes burn Allergy to shrimp not sure of all shellfish    Current Outpatient Prescriptions  Medication Sig Dispense Refill  . Cholecalciferol (VITAMIN D3) 1000 UNIT/SPRAY LIQD Take by mouth.    . EPINEPHrine (EPIPEN 2-PAK) 0.3 mg/0.3 mL IJ SOAJ injection     . exemestane (AROMASIN) 25 MG tablet Take 25 mg by mouth at bedtime.     . methocarbamol (ROBAXIN) 500 MG  tablet Take 500 mg by mouth 4 (four) times daily.    . montelukast (SINGULAIR) 10 MG tablet     . NASONEX 50 MCG/ACT nasal spray Place 1 spray into the nose daily.    . nebivolol (BYSTOLIC) 5 MG tablet Take 1 tablet (5 mg total) by mouth daily as needed. 30 tablet 6  . valACYclovir (VALTREX) 500 MG tablet      No current facility-administered medications for this visit.    Review of Systems Review of Systems  Constitutional: Negative.   Respiratory: Negative.   Cardiovascular: Negative.     Blood pressure 126/78, pulse 80, resp. rate 12, height 5' 7" (1.702 m), weight 163 lb (73.936 kg).  Physical Exam Physical Exam  Constitutional: She is oriented to person, place, and time. She appears well-developed and well-nourished.  HENT:  Mouth/Throat: Oropharynx is clear and moist.  Eyes: Conjunctivae are normal. No scleral icterus.  Neck: Neck supple.  Cardiovascular: Normal rate, regular rhythm and normal heart sounds.   Pulmonary/Chest: Effort normal and breath sounds normal. Left breast exhibits no inverted nipple, no mass, no nipple discharge, no skin change and no tenderness.  Right mastectomy site clean. No signs of local recurrence  Abdominal: Soft. Bowel sounds are normal. There is no hepatomegaly. There is no   tenderness.  Lymphadenopathy:    She has no cervical adenopathy.    She has no axillary adenopathy.  Neurological: She is alert and oriented to person, place, and time.  Skin: Skin is warm and dry.  Psychiatric: Her behavior is normal.    Data Reviewed Mammogram left reviewed and stable.  Assessment    Stable physical exam. 9 yrs post right mastectomy, chemo and radiation for advanced cancer. Pt continue on Aromasin.       Plan    Continue annual mammograms, exams once established in [place]. Records will be faxed to to her new MD in [place] once she is established.     PCP:  No Pcp   SANKAR,SEEPLAPUTHUR G 11/01/2014, 6:10 AM

## 2014-10-31 NOTE — Patient Instructions (Addendum)
The patient is aware to call back for any questions or concerns.  

## 2014-11-01 ENCOUNTER — Encounter: Payer: Self-pay | Admitting: General Surgery

## 2014-11-01 ENCOUNTER — Inpatient Hospital Stay: Payer: Medicare Other | Attending: Oncology

## 2014-11-01 DIAGNOSIS — Z853 Personal history of malignant neoplasm of breast: Secondary | ICD-10-CM | POA: Insufficient documentation

## 2014-11-01 DIAGNOSIS — Z9221 Personal history of antineoplastic chemotherapy: Secondary | ICD-10-CM | POA: Insufficient documentation

## 2014-11-01 DIAGNOSIS — Z170 Estrogen receptor positive status [ER+]: Secondary | ICD-10-CM | POA: Insufficient documentation

## 2014-11-01 DIAGNOSIS — E785 Hyperlipidemia, unspecified: Secondary | ICD-10-CM

## 2014-11-01 DIAGNOSIS — Z1231 Encounter for screening mammogram for malignant neoplasm of breast: Secondary | ICD-10-CM

## 2014-11-01 LAB — LIPID PANEL
Cholesterol: 250 mg/dL — ABNORMAL HIGH (ref 0–200)
HDL: 58 mg/dL (ref >40)
LDL CALC: 172 mg/dL — AB (ref 0–99)
Total CHOL/HDL Ratio: 4.3 RATIO
Triglycerides: 100 mg/dL (ref <150)
VLDL: 20 mg/dL (ref 0–40)

## 2014-11-02 LAB — CANCER ANTIGEN 27.29: CA 27.29: 18.2 U/mL (ref 0.0–38.6)

## 2014-11-06 ENCOUNTER — Other Ambulatory Visit: Payer: Medicare Other

## 2014-11-20 LAB — MISC LABCORP TEST (SEND OUT): LabCorp test name: 602473

## 2014-12-05 ENCOUNTER — Encounter: Payer: Self-pay | Admitting: General Surgery

## 2015-01-11 ENCOUNTER — Other Ambulatory Visit: Payer: Self-pay | Admitting: Oncology

## 2015-03-13 ENCOUNTER — Other Ambulatory Visit: Payer: Medicare Other

## 2015-03-13 ENCOUNTER — Ambulatory Visit: Payer: Medicare Other | Admitting: Oncology

## 2016-08-19 IMAGING — MG MM SCREENING BREAST TOMO UNI L
7 series · 8 of 15 positions shown · non-contrast
Comparison: Previous exam(s).

CLINICAL DATA: Screening.

EXAM:
DIGITAL SCREENING UNILATERAL LEFT MAMMOGRAM WITH TOMO AND CAD

[L XCCL]
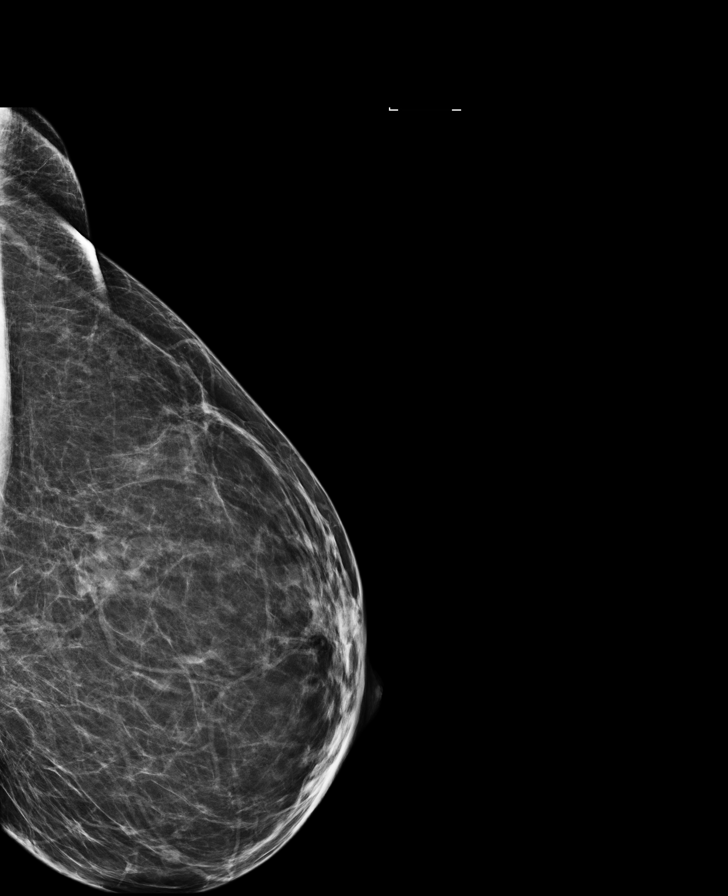

[L MLO]
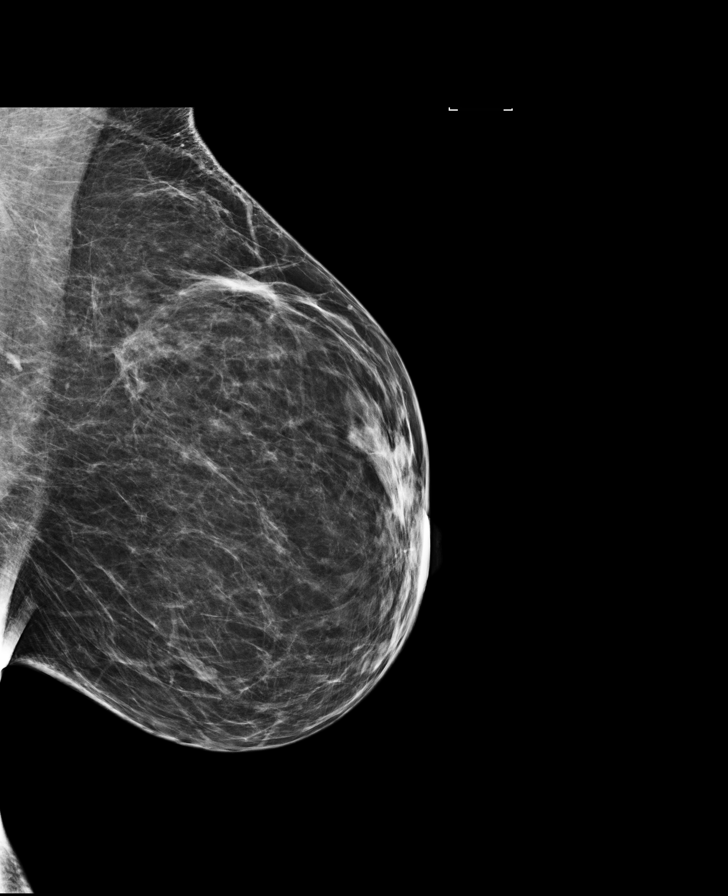

[L MLO synth-2D]
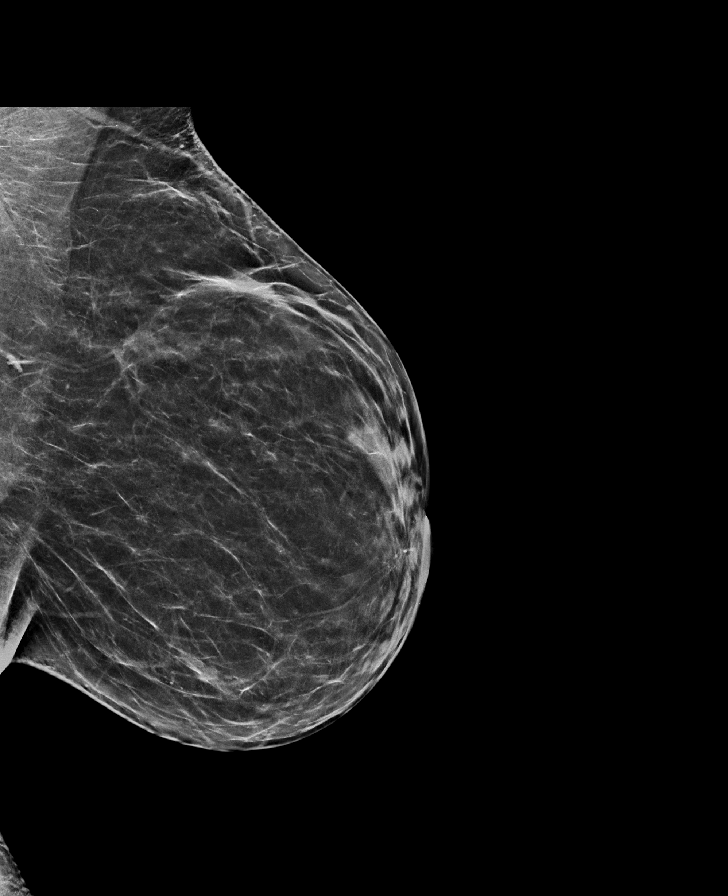

[L CC synth-2D]
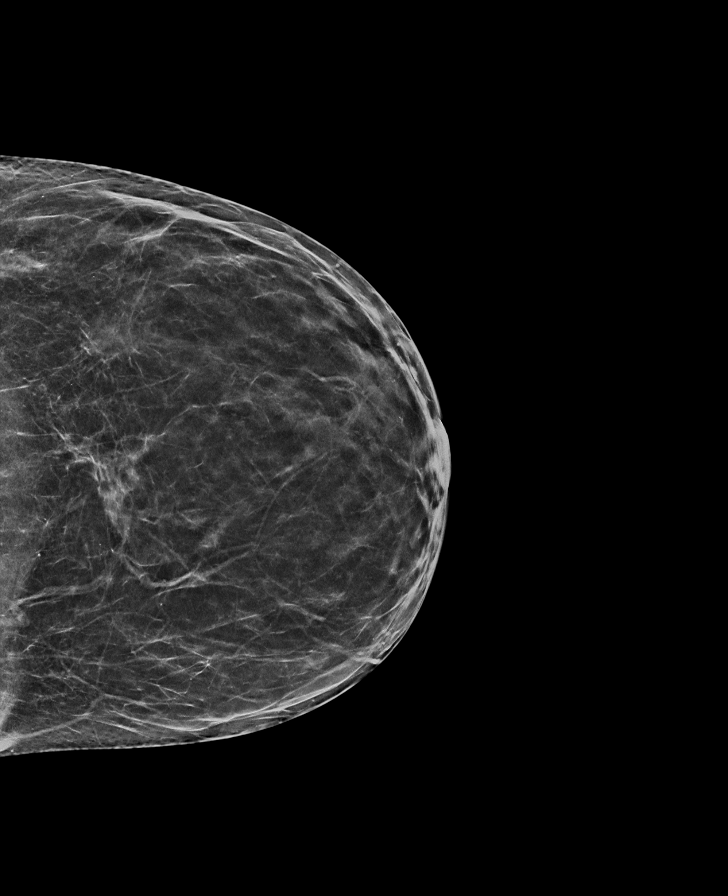

[L CC]
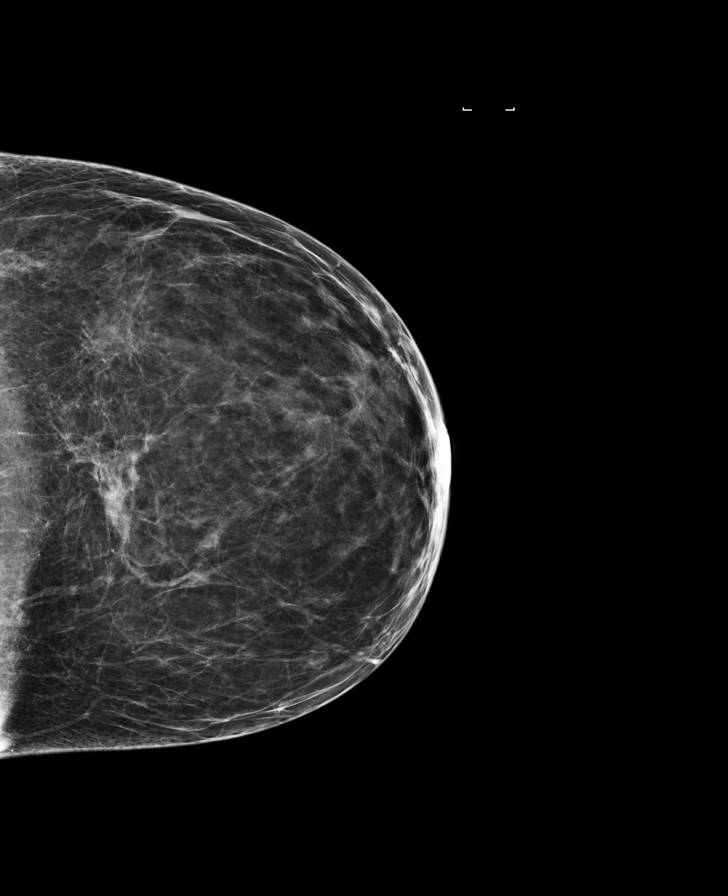

[L CC tomo · 2 of 59 frames shown]
[frame 20/59]
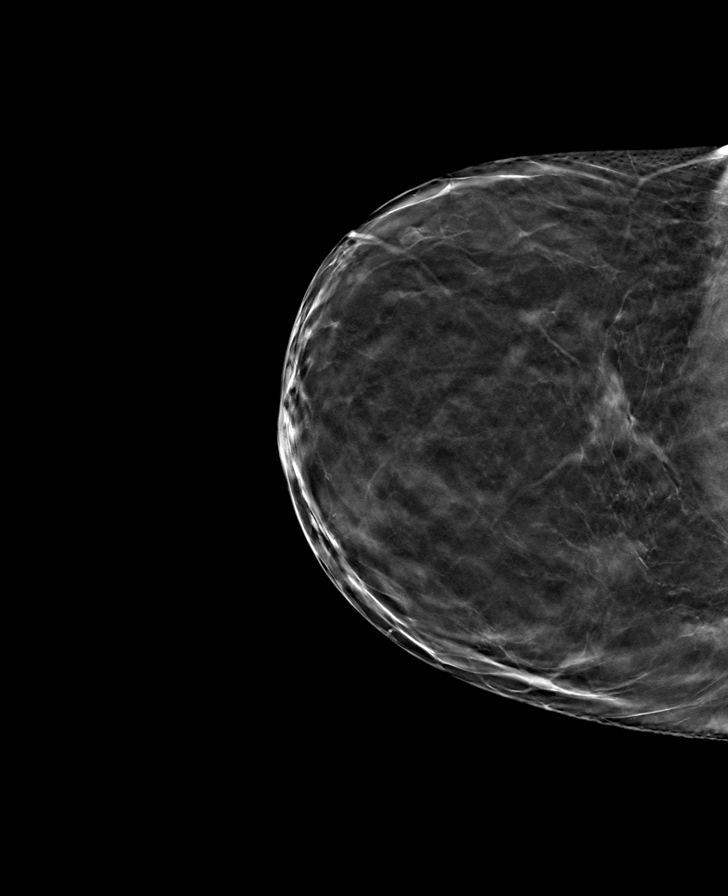
[frame 30/59]
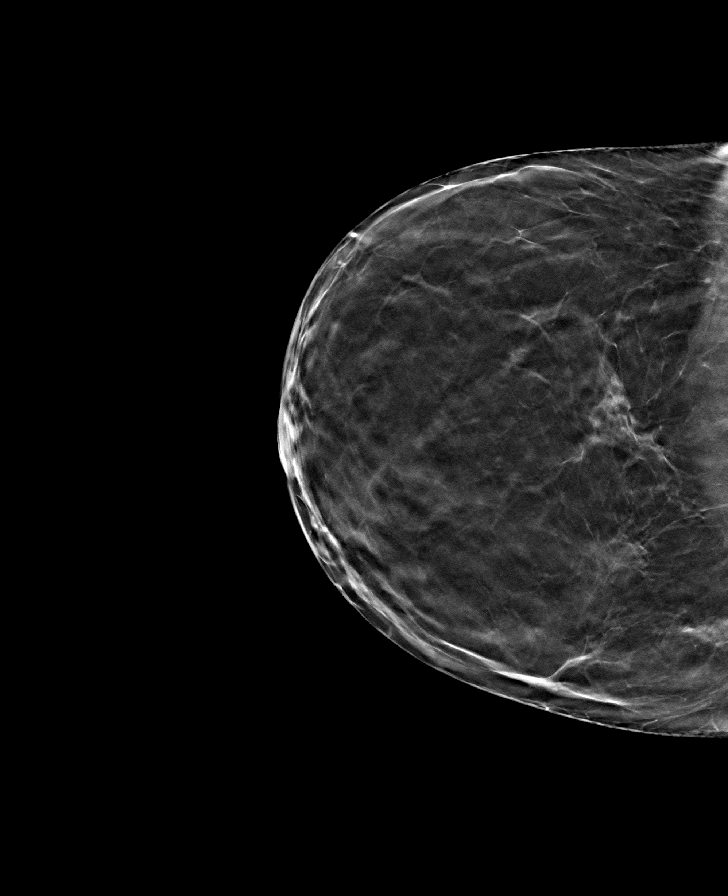

[L MLO tomo · tomo slice 33/65.0]
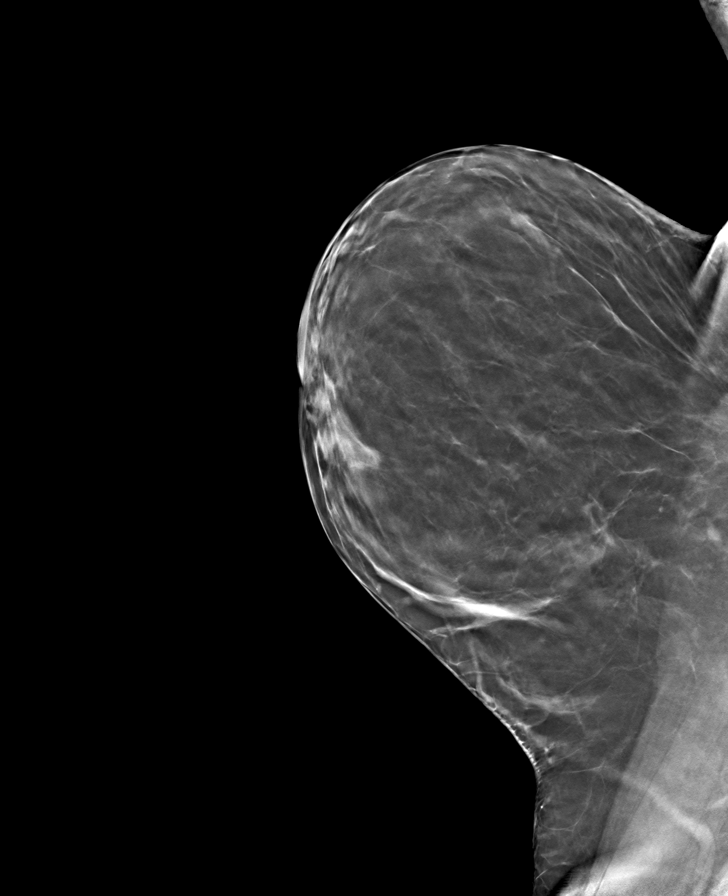

[8 of 15 positions shown; findings below may reference images not displayed]

ACR Breast Density Category b: There are scattered areas of
fibroglandular density.
FINDINGS: There are no findings suspicious for malignancy. Images were
processed with CAD.
IMPRESSION: No mammographic evidence of malignancy. A result letter of this
screening mammogram will be mailed directly to the patient.

RECOMMENDATION:
Screening mammogram in one year. (Code:NO-0-YSN)

BI-RADS CATEGORY  1: Negative.

## 2016-12-10 ENCOUNTER — Inpatient Hospital Stay (HOSPITAL_BASED_OUTPATIENT_CLINIC_OR_DEPARTMENT_OTHER): Payer: Medicare Other | Admitting: Oncology

## 2016-12-10 ENCOUNTER — Inpatient Hospital Stay: Payer: Medicare Other | Attending: Oncology | Admitting: *Deleted

## 2016-12-10 DIAGNOSIS — K219 Gastro-esophageal reflux disease without esophagitis: Secondary | ICD-10-CM | POA: Insufficient documentation

## 2016-12-10 DIAGNOSIS — Z853 Personal history of malignant neoplasm of breast: Secondary | ICD-10-CM | POA: Diagnosis present

## 2016-12-10 DIAGNOSIS — E785 Hyperlipidemia, unspecified: Secondary | ICD-10-CM | POA: Insufficient documentation

## 2016-12-10 DIAGNOSIS — Z170 Estrogen receptor positive status [ER+]: Secondary | ICD-10-CM | POA: Diagnosis not present

## 2016-12-10 DIAGNOSIS — Z87891 Personal history of nicotine dependence: Secondary | ICD-10-CM | POA: Insufficient documentation

## 2016-12-10 DIAGNOSIS — Z90710 Acquired absence of both cervix and uterus: Secondary | ICD-10-CM | POA: Insufficient documentation

## 2016-12-10 DIAGNOSIS — C50919 Malignant neoplasm of unspecified site of unspecified female breast: Secondary | ICD-10-CM

## 2016-12-10 DIAGNOSIS — I890 Lymphedema, not elsewhere classified: Secondary | ICD-10-CM

## 2016-12-10 DIAGNOSIS — Z79899 Other long term (current) drug therapy: Secondary | ICD-10-CM | POA: Insufficient documentation

## 2016-12-10 DIAGNOSIS — I10 Essential (primary) hypertension: Secondary | ICD-10-CM | POA: Insufficient documentation

## 2016-12-10 LAB — COMPREHENSIVE METABOLIC PANEL
ALBUMIN: 5.1 g/dL — AB (ref 3.5–5.0)
ALK PHOS: 22 U/L — AB (ref 38–126)
ALT: 25 U/L (ref 14–54)
AST: 26 U/L (ref 15–41)
Anion gap: 8 (ref 5–15)
BILIRUBIN TOTAL: 0.9 mg/dL (ref 0.3–1.2)
BUN: 19 mg/dL (ref 6–20)
CO2: 27 mmol/L (ref 22–32)
CREATININE: 0.8 mg/dL (ref 0.44–1.00)
Calcium: 9.7 mg/dL (ref 8.9–10.3)
Chloride: 102 mmol/L (ref 101–111)
GFR calc Af Amer: >60 mL/min (ref >60)
GLUCOSE: 106 mg/dL — AB (ref 65–99)
POTASSIUM: 4.5 mmol/L (ref 3.5–5.1)
Sodium: 137 mmol/L (ref 135–145)
TOTAL PROTEIN: 7.8 g/dL (ref 6.5–8.1)

## 2016-12-10 LAB — CBC WITH DIFFERENTIAL/PLATELET
BASOS ABS: 0 10*3/uL (ref 0–0.1)
BASOS PCT: 1 %
EOS ABS: 0.1 10*3/uL (ref 0–0.7)
Eosinophils Relative: 1 %
HCT: 45.4 % (ref 35.0–47.0)
HEMOGLOBIN: 15.1 g/dL (ref 12.0–16.0)
Lymphocytes Relative: 39 %
Lymphs Abs: 1.5 10*3/uL (ref 1.0–3.6)
MCH: 30.4 pg (ref 26.0–34.0)
MCHC: 33.3 g/dL (ref 32.0–36.0)
MCV: 91.3 fL (ref 80.0–100.0)
MONO ABS: 0.3 10*3/uL (ref 0.2–0.9)
MONOS PCT: 7 %
NEUTROS PCT: 52 %
Neutro Abs: 1.9 10*3/uL (ref 1.4–6.5)
Platelets: 274 10*3/uL (ref 150–440)
RBC: 4.97 MIL/uL (ref 3.80–5.20)
RDW: 13.2 % (ref 11.5–14.5)
WBC: 3.7 10*3/uL (ref 3.6–11.0)

## 2016-12-10 NOTE — Progress Notes (Signed)
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 Patient was diagnosed with IBC when she was 54 which was 12 years ago. Patient was last seen by Dr. Bryson Ha in September 2016 prior to her move to [place]. Per his last note she was receiving yearly Zometa for osteoporosis prevention with regular dental checkups and had regular yearly  mammograms performed. Her CA-27-29 was slightly trending up but was still within normal limits. Last CA-27-29 on record was from 09/04/2014 and was 24.1. She is here for a visit and would like lab work drawn because her oncologist in [place] does not recommend tumor markers or lab work 10 years out.   Today patient presents to have her labs drawn. She currently feels well. She remains very active. She is status post right mastectomy. She self reports a mammogram and ultrasound of left breast in [place] in October 2018 revealing no evidence of recurrence. She continues to take Aromasin and will continue for 10 years. She took tamoxifen for 5 years. She complains of mild lymphedema of right arm but uses a compression sleeve daily. She last had Zometa approximately 6 months  ago. She continues to get regular dental checkups. She is wanting to know her CA-27-29 level because it has not been drawn in over 6 months. Her oncologist does not recommend drawing tumor markers past 10 years. She offers no further complaints.  ECOG FS:0 - Asymptomatic  Review of systems- Review of Systems  Constitutional: Negative.  Negative for chills, fever, malaise/fatigue and weight loss.  HENT: Negative.   Eyes: Negative.   Respiratory: Negative.   Cardiovascular: Negative.   Gastrointestinal: Negative.   Genitourinary: Negative.   Musculoskeletal: Negative.   Skin: Negative.   Neurological: Negative.  Negative for weakness.  Endo/Heme/Allergies: Negative.   Psychiatric/Behavioral: Negative.      Current treatment- Aromasin.   Allergies  Allergen Reactions  . Shrimp [Shellfish Allergy] Itching    Eyes burn Allergy to shrimp not sure of all shellfish     Past Medical History:  Diagnosis Date  . Breast cancer ([place]) 2007   chemotherapy/radiation- right breast/BRCA and my risk negative  . GERD (gastroesophageal reflux disease) 2012  . History of TMJ syndrome 2012  . HLD (hyperlipidemia)   . HTN (hypertension) 2011  . Migraines   . Personal history of tobacco use, presenting hazards to health   . Special screening for malignant neoplasms, colon   . Tachycardia      Past Surgical History:  Procedure Laterality Date  . ABDOMINAL HYSTERECTOMY    . BREAST BIOPSY Right 2007  . COLONOSCOPY  2009   [facility] Dr. Candace Cruise  . LAPAROSCOPIC TOTAL HYSTERECTOMY    . MASTECTOMY Right 12/2005   chemotherapy/radiation  . TONSILLECTOMY     as a child  Social History   Socioeconomic History  . Marital status: Married    Spouse name: Not on file  . Number of children: Not on file  . Years of education: Not on file  . Highest education level: Not on file  Social Needs  . Financial resource strain: Not on file  . Food insecurity - worry: Not on file  . Food insecurity -  inability: Not on file  . Transportation needs - medical: Not on file  . Transportation needs - non-medical: Not on file  Occupational History  . Not on file  Tobacco Use  . Smoking status: Former Smoker    Packs/day: 1.50    Years: 10.00    Pack years: 15.00    Types: Cigarettes  . Smokeless tobacco: Never Used  Substance and Sexual Activity  . Alcohol use: No  . Drug use: No  . Sexual activity: Not on file  Other Topics Concern  . Not on file  Social History Narrative  . Not on file    Family History  Adopted: Yes  Problem Relation Age of Onset  . Cancer Other        breast, no relationship listed     Current Outpatient Medications:  .  Cholecalciferol (VITAMIN D3) 1000 UNIT/SPRAY LIQD, Take by mouth., Disp: , Rfl:  .  EPINEPHrine (EPIPEN 2-PAK) 0.3 mg/0.3 mL IJ SOAJ injection, , Disp: , Rfl:  .  exemestane (AROMASIN) 25 MG tablet, Take 25 mg by mouth at bedtime. , Disp: , Rfl:  .  methocarbamol (ROBAXIN) 500 MG tablet, Take 500 mg by mouth 4 (four) times daily., Disp: , Rfl:  .  montelukast (SINGULAIR) 10 MG tablet, , Disp: , Rfl:  .  NASONEX 50 MCG/ACT nasal spray, Place 1 spray into the nose daily., Disp: , Rfl:  .  nebivolol (BYSTOLIC) 5 MG tablet, Take 1 tablet (5 mg total) by mouth daily as needed., Disp: 30 tablet, Rfl: 6 .  valACYclovir (VALTREX) 500 MG tablet, , Disp: , Rfl:   Physical exam: There were no vitals filed for this visit. Physical Exam  Constitutional: She is oriented to person, place, and time and well-developed, well-nourished, and in no distress. Vital signs are normal.  HENT:  Head: Normocephalic and atraumatic.  Eyes: Pupils are equal, round, and reactive to light.  Neck: Normal range of motion. Neck supple.  Cardiovascular: Normal rate and regular rhythm.  Pulmonary/Chest: Effort normal and breath sounds normal.    Abdominal: Soft. Normal appearance and bowel sounds are normal.  Musculoskeletal: Normal range of motion.       Right  shoulder: She exhibits swelling.       Right elbow: She exhibits swelling.       Right wrist: She exhibits swelling.  Neurological: She is alert and oriented to person, place, and time.  Skin: Skin is warm, dry and intact.  Psychiatric: Mood, memory, affect and judgment normal.     CMP Latest Ref Rng & Units 12/10/2016  Glucose 65 - 99 mg/dL 106(H)  BUN 6 - 20 mg/dL 19  Creatinine 0.44 - 1.00 mg/dL 0.80  Sodium 135 - 145 mmol/L 137  Potassium 3.5 - 5.1 mmol/L 4.5  Chloride 101 - 111 mmol/L 102  CO2 22 - 32 mmol/L 27  Calcium 8.9 - 10.3 mg/dL 9.7  Total Protein 6.5 - 8.1 g/dL 7.8  Total Bilirubin 0.3 - 1.2 mg/dL 0.9  Alkaline Phos 38 - 126 U/L 22(L)  AST 15 - 41 U/L 26  ALT 14 - 54 U/L 25   CBC Latest Ref Rng & Units 12/10/2016  WBC 3.6 - 11.0 K/uL 3.7  Hemoglobin 12.0 - 16.0 g/dL 15.1  Hematocrit 35.0 - 47.0 % 45.4  Platelets 150 - 440 K/uL 274    No images are attached to the encounter.  No results found.   Assessment and plan- Patient is a 51 y.o. female who presents to have her labs checked. She is a former Dr. Bryson Ha patient and is currently living in [place] and is here for a visit. On assessment, she is a well appearing women. Physical exam is unremarkable. Mild edema noted on right arm extended down to wrist. Patient states she was taught previously at a lymphedema clinic how to massage when she accumulates fluid. She continues to wear her sleeve as needed. She is mildly hypertensive but all other vital signs are normal. Labs are unremarkable.  1. History of Inflammatory breast cancer: Recent mammogram and ultrasound did not reveal evidence of recurrence. CA 27.29 is still within normal limits at 20.3. Patient has asked to reestablish care with [place] and has requested Dr. Rogue Bussing. Return to clinic in 6 months with labs and MD assessment. She is continuing Aromasin per her oncologist in [place].  2. Osteoporosis prevention: Currently on Zometa. Most recent bone  density scan per patient was normal. Last Zometa infusion was approximately 3-4 months ago. Patient states current MD does not wish to continue Zometa in the future. Still on Aromasin.  3. Lymphedema of right arm: Continue sleeve as needed. Continue massage as needed.   Visit Diagnosis 1. Inflammatory carcinoma of breast, unspecified laterality ([place])     Patient expressed understanding and was in agreement with this plan. She also understands that She can call clinic at any time with any questions, concerns, or complaints.   Greater than 50% was spent in counseling and coordination of care with this patient including but not limited to discussion of the relevant topics above (See A&P) including, but not limited to diagnosis and management of acute and chronic medical conditions.    Faythe Casa, AGNP-C [facility] at [facility]- [phone] Pager- [phone] 12/11/2016 3:15 PM

## 2016-12-11 LAB — CANCER ANTIGEN 27.29: CA 27.29: 20.3 U/mL (ref 0.0–38.6)

## 2016-12-11 LAB — VITAMIN D 25 HYDROXY (VIT D DEFICIENCY, FRACTURES): Vit D, 25-Hydroxy: 43.3 ng/mL (ref 30.0–100.0)

## 2016-12-12 ENCOUNTER — Other Ambulatory Visit: Payer: Self-pay | Admitting: Oncology

## 2016-12-12 ENCOUNTER — Telehealth: Payer: Self-pay | Admitting: General Surgery

## 2016-12-12 DIAGNOSIS — Z853 Personal history of malignant neoplasm of breast: Secondary | ICD-10-CM

## 2016-12-12 NOTE — Telephone Encounter (Signed)
I HAVE CALLED AN LEFT A MESSAGE FOR PATIENT TO CALL & MAKE AN APPOINTMENT WITH DR Dario Guardian SEEN 10-31-13) TO RE ESTABLISH CARE HX OF BR CA REF'D BY JENNIFER BURNS NP([place]  CTR)

## 2016-12-30 ENCOUNTER — Telehealth: Payer: Self-pay | Admitting: General Surgery

## 2016-12-30 NOTE — Telephone Encounter (Signed)
PATIENT IS A FORMER PATIENT OF DR Dario Guardian SEEN 10-31-13) SHE MOVED TO [place] AND SHE WANTED TO REESTABLISH CARE WITH DR Jamal Collin AND HAS ALSO ESTABLISHED CARE WITH THE CANCER CTR.SHE WILL NOW BE COMING DOWN ONCE A YEAR. SHE IS 12 YRS OUT FROM HAVING INFLAMMATORY BR CA AND DR Jamal Collin AND DR Oliva Bustard ADVISED PATIENT TO BE FOLLOWED EVERY YEAR,BUT THE DOCTORS THERE DISAGREE SINCE SHE IN SO FAR OUT.SHE WOULD LIKE TO SEE YOU IF YOU WILL FOLLOW HER EVERY YEAR. PLEASE ADVICE   (DOESN'T NEED A MAMMO UNTIL November OF 2019. SHE WOULD LIKE ALL APPOINTMENTS ON THE SAME DAY,EVEN IF SHE HAS TO CALL HERSELF. SHE DOESN'T WANT [place] TO PUT HER IN RECALL BECAUSE A MONTH WOULD NOT ALLOW HER ENOUGH TIME TO MAKE ARRANGEMENTS.

## 2017-01-07 NOTE — Telephone Encounter (Signed)
I have left a message for patient letting her know Dr Jamal Collin has spoken with Dr Bary Castilla about following this patient. He agrees to follow. She wants her mammo /Dr Byrnett's appointment & appointment with the cancer center on the same day if possible.We will need to put her in the recall when she call back & is in agreement with Dr Bary Castilla following her.

## 2017-01-14 ENCOUNTER — Telehealth: Payer: Self-pay | Admitting: General Surgery

## 2017-01-14 NOTE — Telephone Encounter (Signed)
Will be glad to see her.  Try to coordinate things as best as we can.

## 2017-01-14 NOTE — Telephone Encounter (Signed)
I HAVE LEFT A MESSAGE FOR PATIENT TO CALL BACK & LET HER KNOW WE HAVE PUT HER IN RECALL & WE WILL CALL HER TO SCHEDULE ONCE THE November 2019 SCHEDULE IS AVAILABLE. DR Jamal Collin SPOKE TO DR BYRNETT & HE HAS AGREED TO FOLLOW PT. WE NEED TO FIND OUT WHERE SHE HAD HER LAST MAMMOGRAMS. THE LAST ONES WE HAVE IN Epic IS 2016. SHE WILL NEED TO BRING THOSE WI TH HER. PLEASE PUT THIS IN RECALLS.(MAKE NOTE SHE'S IN RECALLS)

## 2017-01-15 ENCOUNTER — Telehealth: Payer: Self-pay | Admitting: General Surgery

## 2017-01-15 NOTE — Telephone Encounter (Signed)
PATIENT CALLED & AGREED TO BEING PUT IN AS A RECALL FOR DR BYTNETT.

## 2017-02-20 ENCOUNTER — Telehealth: Payer: Self-pay | Admitting: Pharmacist

## 2017-02-20 NOTE — Telephone Encounter (Signed)
Oral Chemotherapy Pharmacist Encounter   I was contacted by Adah Perl who informed me that this patient had a high copay for her exemestane (AROMASIN). Currently there are copay funds available for breast cancer through [employer] and [employer].   I called and spoke with Ms. Haynie and she was unsure of her annual income but stated she will call me with that information on Monday. At that time we will see if she qualifies for assistance.   Darl Pikes, PharmD, BCPS Hematology/Oncology Clinical Pharmacist [facility] [phone]  02/20/2017 1:05 PM

## 2017-02-23 ENCOUNTER — Telehealth: Payer: Self-pay | Admitting: Oncology

## 2017-02-23 NOTE — Telephone Encounter (Signed)
Oral Oncology Patient Advocate Encounter  Called and left patient a message to please call me so I can help her with grant funds for her medication.    [place] Patient Advocate [phone] 02/23/2017 12:58 PM

## 2017-02-25 ENCOUNTER — Telehealth: Payer: Self-pay | Admitting: Oncology

## 2017-02-25 NOTE — Telephone Encounter (Signed)
Oral Oncology Patient Advocate Encounter  Was successful in securing patient an $ 4000.00 grant from [employer] ([employer]) to provide copayment coverage for her aromasin.  This will keep the out of pocket expense at $0.    I have spoken with the patient.    The billing information is as follows and has been shared with [place].   Member ID: [id number] Group ID: [id number] RxBin: [id number] PCN: [id number] Dates of Eligibility: 08/29/2016 through 02/26/2018    [place] Patient Advocate [phone] 02/25/2017 8:34 AM

## 2017-03-12 ENCOUNTER — Telehealth: Payer: Self-pay | Admitting: Oncology

## 2017-03-12 NOTE — Telephone Encounter (Signed)
Oral Oncology Patient Advocate Encounter  [employer] Co-Pay [employer] Program diagnosis verification form was completed and faxed to [employer] at [phone]  This is the last step from the office needed to secure continued patient access to funding.   [place] Patient Advocate [phone] 03/12/2017 8:52 AM

## 2017-03-23 ENCOUNTER — Telehealth: Payer: Self-pay | Admitting: Oncology

## 2017-03-23 NOTE — Telephone Encounter (Signed)
Oral Oncology Patient Advocate Encounter  Received an e-mail from patient stating she had gotten a letter from [employer] saying they need information from our office, or her grant will be closed. I called [employer] and spoke with AMY D, and she said they had received all the information they needed. I e-mailed the patient back to let her know every thing had been taken care of.    [place] Patient Advocate [phone] 03/23/2017 11:22 AM

## 2017-06-10 ENCOUNTER — Inpatient Hospital Stay: Payer: Medicare Other

## 2017-06-10 ENCOUNTER — Inpatient Hospital Stay: Payer: Medicare Other | Admitting: Internal Medicine

## 2017-09-15 ENCOUNTER — Other Ambulatory Visit: Payer: Self-pay | Admitting: *Deleted

## 2017-09-15 DIAGNOSIS — Z853 Personal history of malignant neoplasm of breast: Secondary | ICD-10-CM

## 2017-09-18 ENCOUNTER — Other Ambulatory Visit: Payer: Self-pay

## 2017-09-18 DIAGNOSIS — Z1231 Encounter for screening mammogram for malignant neoplasm of breast: Secondary | ICD-10-CM

## 2017-11-16 ENCOUNTER — Inpatient Hospital Stay: Payer: Medicare Other | Attending: Internal Medicine

## 2017-11-16 ENCOUNTER — Inpatient Hospital Stay (HOSPITAL_BASED_OUTPATIENT_CLINIC_OR_DEPARTMENT_OTHER): Payer: Medicare Other | Admitting: Internal Medicine

## 2017-11-16 ENCOUNTER — Other Ambulatory Visit: Payer: Self-pay

## 2017-11-16 DIAGNOSIS — Z79899 Other long term (current) drug therapy: Secondary | ICD-10-CM | POA: Insufficient documentation

## 2017-11-16 DIAGNOSIS — C50811 Malignant neoplasm of overlapping sites of right female breast: Secondary | ICD-10-CM | POA: Diagnosis not present

## 2017-11-16 DIAGNOSIS — I10 Essential (primary) hypertension: Secondary | ICD-10-CM | POA: Diagnosis not present

## 2017-11-16 DIAGNOSIS — Z87891 Personal history of nicotine dependence: Secondary | ICD-10-CM | POA: Diagnosis not present

## 2017-11-16 DIAGNOSIS — Z79811 Long term (current) use of aromatase inhibitors: Secondary | ICD-10-CM | POA: Insufficient documentation

## 2017-11-16 DIAGNOSIS — Z853 Personal history of malignant neoplasm of breast: Secondary | ICD-10-CM

## 2017-11-16 DIAGNOSIS — Z170 Estrogen receptor positive status [ER+]: Secondary | ICD-10-CM | POA: Insufficient documentation

## 2017-11-16 DIAGNOSIS — Z90710 Acquired absence of both cervix and uterus: Secondary | ICD-10-CM | POA: Insufficient documentation

## 2017-11-16 LAB — CBC WITH DIFFERENTIAL/PLATELET
ABS IMMATURE GRANULOCYTES: 0 10*3/uL (ref 0.00–0.07)
BASOS PCT: 1 %
Basophils Absolute: 0 10*3/uL (ref 0.0–0.1)
EOS ABS: 0.1 10*3/uL (ref 0.0–0.5)
EOS PCT: 3 %
HCT: 44.1 % (ref 36.0–46.0)
Hemoglobin: 14.5 g/dL (ref 12.0–15.0)
Immature Granulocytes: 0 %
Lymphocytes Relative: 46 %
Lymphs Abs: 1.6 10*3/uL (ref 0.7–4.0)
MCH: 30.2 pg (ref 26.0–34.0)
MCHC: 32.9 g/dL (ref 30.0–36.0)
MCV: 91.9 fL (ref 80.0–100.0)
MONO ABS: 0.3 10*3/uL (ref 0.1–1.0)
MONOS PCT: 9 %
NEUTROS ABS: 1.4 10*3/uL — AB (ref 1.7–7.7)
Neutrophils Relative %: 41 %
PLATELETS: 248 10*3/uL (ref 150–400)
RBC: 4.8 MIL/uL (ref 3.87–5.11)
RDW: 12.6 % (ref 11.5–15.5)
WBC: 3.5 10*3/uL — AB (ref 4.0–10.5)
nRBC: 0 % (ref 0.0–0.2)

## 2017-11-16 LAB — COMPREHENSIVE METABOLIC PANEL
ALT: 21 U/L (ref 0–44)
ANION GAP: 9 (ref 5–15)
AST: 23 U/L (ref 15–41)
Albumin: 4.7 g/dL (ref 3.5–5.0)
Alkaline Phosphatase: 23 U/L — ABNORMAL LOW (ref 38–126)
BUN: 15 mg/dL (ref 6–20)
CHLORIDE: 102 mmol/L (ref 98–111)
CO2: 27 mmol/L (ref 22–32)
Calcium: 9.3 mg/dL (ref 8.9–10.3)
Creatinine, Ser: 0.82 mg/dL (ref 0.44–1.00)
Glucose, Bld: 98 mg/dL (ref 70–99)
Potassium: 3.8 mmol/L (ref 3.5–5.1)
Sodium: 138 mmol/L (ref 135–145)
TOTAL PROTEIN: 7.3 g/dL (ref 6.5–8.1)
Total Bilirubin: 0.9 mg/dL (ref 0.3–1.2)

## 2017-11-16 NOTE — Assessment & Plan Note (Addendum)
#  Inflammatory Breast cancer-stage IIIb; ER PR positive HER-2/neu positive currently on Aromasin.  Clinically no evidence of recurrence.  Stable.  #Continue Aromasin.  Patient has mammogram of the contralateral breast tomorrow/follow-up with Dr. Bary Castilla.  #As per ASCO guidelines- recommend Zometa 4 mg IV-adjuvant breast cancer/bone health on annual basis.  Will order.  # ANC- 1.4; monitor for now.  Patient has a repeat blood work in few months with her PCP in [place]; if any worse she will let us know.  # Hot flash-   # DISPOSITION: # will call VZ:SMOLMB injection # follow up in 12 months/ cbc/cmp/ca-27-29/MD/Zometa.- Dr.B

## 2017-11-16 NOTE — Progress Notes (Signed)
Here for follow up [place] ( [place] -stated here for yearly follow up. Stated feeling " good " hot flasses go in waves- currerntly on " the wave of hot flashes "

## 2017-11-16 NOTE — Progress Notes (Signed)
[facility] OFFICE PROGRESS NOTE  Patient Care Team: Patient, No Pcp Per as PCP - General (General Practice) Christene Lye, MD (General Surgery)  Cancer Staging No matching staging information was found for the patient.   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  # TAH & BSO.   # Genetic testing- NEG   DIAGNOSIS: Right breast cancer  STAGE:  IIIB; GOALS: cure  CURRENT/MOST RECENT THERAPY : extended aromasin      Carcinoma of overlapping sites of right breast in female, estrogen receptor positive [place]      INTERVAL HISTORY:  Barbara Fleming 52y.o.  female pleasant patient above history of stage III inflammatory breast cancer ER PR positive HER-2/neu positive-currently on extended therapy with Aromasin is here for follow-up.   Patient [place] currently residing in [place]  Patient patient is interested in follow-up of her oncology care locally.  She is an appointment to meet with Dr. BBary Castillatomorrow/mammogram tomorrow.  Patient continues to have intermittent hot flashes.  Intermittent neck pain back pain-which is currently stable.  Not any worse.  Review of Systems  Constitutional: Negative for chills, diaphoresis, fever, malaise/fatigue and weight loss.  HENT: Negative for nosebleeds and sore throat.   Eyes: Negative for double vision.  Respiratory: Negative for cough, hemoptysis, sputum production, shortness of breath and wheezing.   Cardiovascular: Negative for chest pain, palpitations, orthopnea and leg swelling.  Gastrointestinal: Negative for abdominal pain, blood in stool, constipation, diarrhea, heartburn, melena, nausea and vomiting.  Genitourinary: Negative for dysuria, frequency and urgency.   Musculoskeletal: Negative for back pain and joint pain.  Skin: Negative.  Negative for itching and rash.  Neurological: Negative for dizziness, tingling, focal weakness, weakness and headaches.  Endo/Heme/Allergies: Does not bruise/bleed easily.  Psychiatric/Behavioral: Negative for depression. The patient is not nervous/anxious and does not have insomnia.       PAST MEDICAL HISTORY :  Past Medical History:  Diagnosis Date  . Breast cancer [place] 2007   chemotherapy/radiation- right breast/BRCA and my risk negative  . GERD (gastroesophageal reflux disease) 2012  . History of TMJ syndrome 2012  . HLD (hyperlipidemia)   . HTN (hypertension) 2011  . Migraines   . Personal history of tobacco use, presenting hazards to health   . Special screening for malignant neoplasms, colon   . Tachycardia     PAST SURGICAL HISTORY :   Past Surgical History:  Procedure Laterality Date  . ABDOMINAL HYSTERECTOMY    . BREAST BIOPSY Right 2007  . COLONOSCOPY  2009   [facility] Dr. OCandace Cruise . LAPAROSCOPIC TOTAL HYSTERECTOMY    . MASTECTOMY Right 12/2005   chemotherapy/radiation  . TONSILLECTOMY     as a child    FAMILY HISTORY :   Family History  Adopted: Yes  Problem Relation Age of Onset  . Cancer Other        breast, no relationship listed    SOCIAL HISTORY:   Social History   Tobacco Use  . Smoking status: Former Smoker    Packs/day: 1.50    Years: 10.00    Pack years: 15.00    Types: Cigarettes  . Smokeless tobacco: Never Used  Substance Use Topics  . Alcohol use: No  . Drug use: No    ALLERGIES:  is allergic  to shrimp [shellfish allergy].  MEDICATIONS:  Current Outpatient Medications  Medication Sig Dispense Refill  . Cholecalciferol (VITAMIN D3) 1000 UNIT/SPRAY LIQD Take by mouth.     . Cyanocobalamin (VITAMIN B 12 PO) Take by mouth.    Marland Kitchen exemestane (AROMASIN) 25 MG tablet Take 25 mg by mouth at bedtime.     . MULTIPLE VIT-MIN-CALCIUM-FA PO Take by mouth.    Marland Kitchen NASONEX 50  MCG/ACT nasal spray Place 1 spray into the nose daily.    . NON FORMULARY     . Omega-3 1000 MG CAPS Take by mouth.    . valACYclovir (VALTREX) 500 MG tablet     . EPINEPHrine (EPIPEN 2-PAK) 0.3 mg/0.3 mL IJ SOAJ injection     . montelukast (SINGULAIR) 10 MG tablet     . nebivolol (BYSTOLIC) 5 MG tablet Take 1 tablet (5 mg total) by mouth daily as needed. (Patient not taking: Reported on 11/16/2017) 30 tablet 6   No current facility-administered medications for this visit.     PHYSICAL EXAMINATION: ECOG PERFORMANCE STATUS: 0 - Asymptomatic  BP (!) 156/91 (BP Location: Left Arm, Patient Position: Sitting)   Pulse (!) 102   Temp (!) 97 F (36.1 C) (Tympanic)   Resp 18   Wt 163 lb (73.9 kg)   BMI 25.53 kg/m   Filed Weights   11/16/17 1021  Weight: 163 lb (73.9 kg)    Physical Exam  Constitutional: She is oriented to person, place, and time and well-developed, well-nourished, and in no distress.  HENT:  Head: Normocephalic and atraumatic.  Mouth/Throat: Oropharynx is clear and moist. No oropharyngeal exudate.  Eyes: Pupils are equal, round, and reactive to light.  Neck: Normal range of motion. Neck supple.  Cardiovascular: Normal rate and regular rhythm.  Pulmonary/Chest: No respiratory distress. She has no wheezes.  Abdominal: Soft. Bowel sounds are normal. She exhibits no distension and no mass. There is no tenderness. There is no rebound and no guarding.  Musculoskeletal: Normal range of motion. She exhibits no edema or tenderness.  Neurological: She is alert and oriented to person, place, and time.  Skin: Skin is warm.  Right mastectomy noted.  No lumps or bumps.  Left breast no dominant masses felt.  Psychiatric: Affect normal.      LABORATORY DATA:  I have reviewed the data as listed    Component Value Date/Time   NA 138 11/16/2017 0957   NA 133 (L) 04/10/2014 1344   K 3.8 11/16/2017 0957   K 4.0 04/10/2014 1344   CL 102 11/16/2017 0957   CL 101 04/10/2014  1344   CO2 27 11/16/2017 0957   CO2 29 04/10/2014 1344   GLUCOSE 98 11/16/2017 0957   GLUCOSE 93 04/10/2014 1344   BUN 15 11/16/2017 0957   BUN 15 04/10/2014 1344   CREATININE 0.82 11/16/2017 0957   CREATININE 0.62 04/10/2014 1344   CALCIUM 9.3 11/16/2017 0957   CALCIUM 9.4 04/10/2014 1344   PROT 7.3 11/16/2017 0957   PROT 7.5 04/10/2014 1344   ALBUMIN 4.7 11/16/2017 0957   ALBUMIN 5.1 (H) 04/10/2014 1344   AST 23 11/16/2017 0957   AST 23 04/10/2014 1344   ALT 21 11/16/2017 0957   ALT 23 04/10/2014 1344   ALKPHOS 23 (L) 11/16/2017 0957   ALKPHOS 26 (L) 04/10/2014 1344   BILITOT 0.9 11/16/2017 0957   BILITOT 0.7 04/10/2014 1344   GFRNONAA >60 11/16/2017 0957   GFRNONAA >60 04/10/2014 1344   GFRAA >60 11/16/2017 0957  GFRAA >60 04/10/2014 1344    No results found for: SPEP, UPEP  Lab Results  Component Value Date   WBC 3.5 (L) 11/16/2017   NEUTROABS 1.4 (L) 11/16/2017   HGB 14.5 11/16/2017   HCT 44.1 11/16/2017   MCV 91.9 11/16/2017   PLT 248 11/16/2017      Chemistry      Component Value Date/Time   NA 138 11/16/2017 0957   NA 133 (L) 04/10/2014 1344   K 3.8 11/16/2017 0957   K 4.0 04/10/2014 1344   CL 102 11/16/2017 0957   CL 101 04/10/2014 1344   CO2 27 11/16/2017 0957   CO2 29 04/10/2014 1344   BUN 15 11/16/2017 0957   BUN 15 04/10/2014 1344   CREATININE 0.82 11/16/2017 0957   CREATININE 0.62 04/10/2014 1344      Component Value Date/Time   CALCIUM 9.3 11/16/2017 0957   CALCIUM 9.4 04/10/2014 1344   ALKPHOS 23 (L) 11/16/2017 0957   ALKPHOS 26 (L) 04/10/2014 1344   AST 23 11/16/2017 0957   AST 23 04/10/2014 1344   ALT 21 11/16/2017 0957   ALT 23 04/10/2014 1344   BILITOT 0.9 11/16/2017 0957   BILITOT 0.7 04/10/2014 1344       RADIOGRAPHIC STUDIES: I have personally reviewed the radiological images as listed and agreed with the findings in the report. No results found.   ASSESSMENT & PLAN:  Carcinoma of overlapping sites of right breast in  female, estrogen receptor positive ([place]) # Inflammatory Breast cancer-stage IIIb; ER PR positive HER-2/neu positive currently on Aromasin.  Clinically no evidence of recurrence.  Stable.  #Continue Aromasin.  Patient has mammogram of the contralateral breast tomorrow/follow-up with Dr. Bary Castilla.  #As per ASCO guidelines- recommend Zometa 4 mg IV-adjuvant breast cancer/bone health on annual basis.  Will order.  # ANC- 1.4; monitor for now.  Patient has a repeat blood work in few months with her PCP in [place]; if any worse she will let us know.  # Hot flash-   # DISPOSITION: # will call VQ:XIHWTU injection # follow up in 12 months/ cbc/cmp/ca-27-29/MD/Zometa.- Dr.B   No orders of the defined types were placed in this encounter.  All questions were answered. The patient knows to call the clinic with any problems, questions or concerns.      Cammie Sickle, MD 11/16/2017 12:29 PM

## 2017-11-17 ENCOUNTER — Other Ambulatory Visit: Payer: Self-pay

## 2017-11-17 ENCOUNTER — Ambulatory Visit (INDEPENDENT_AMBULATORY_CARE_PROVIDER_SITE_OTHER): Payer: Medicare Other | Admitting: General Surgery

## 2017-11-17 ENCOUNTER — Encounter: Payer: Self-pay | Admitting: General Surgery

## 2017-11-17 ENCOUNTER — Ambulatory Visit
Admission: RE | Admit: 2017-11-17 | Discharge: 2017-11-17 | Disposition: A | Discharge status: Home / Self Care | Payer: Medicare Other | Source: Ambulatory Visit | Attending: General Surgery | Admitting: General Surgery

## 2017-11-17 VITALS — BP 127/81 | HR 83 | Temp 97.9°F | Resp 12 | Ht 67.0 in | Wt 161.0 lb

## 2017-11-17 DIAGNOSIS — Z1231 Encounter for screening mammogram for malignant neoplasm of breast: Secondary | ICD-10-CM | POA: Diagnosis present

## 2017-11-17 DIAGNOSIS — Z853 Personal history of malignant neoplasm of breast: Secondary | ICD-10-CM | POA: Diagnosis not present

## 2017-11-17 HISTORY — DX: Personal history of antineoplastic chemotherapy: Z92.21

## 2017-11-17 HISTORY — DX: Personal history of irradiation: Z92.3

## 2017-11-17 LAB — CANCER ANTIGEN 27.29: CAN 27.29: 15.2 U/mL (ref 0.0–38.6)

## 2017-11-17 NOTE — Progress Notes (Signed)
Patient ID: Barbara Fleming, female   DOB: [birth year], 52 y.o.   MRN: [id number]  Chief Complaint  Patient presents with  . Follow-up    HPI Barbara Fleming is a 52 y.o. female.  who presents for her follow up right breast cancer and a breast evaluation, former patient of Dr Jamal Collin. The most recent mammogram was done on 11-17-17. No new breast issues. Patient does perform regular self breast checks and gets regular mammograms done.  No new breast issues.  She moved to [place], [place] in 2016 and is not impressed with the Oncologist. She states it took her 2 years to find a PCP. 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 Prior mammograms were taken at [facility].  HPI  Past Medical History:  Diagnosis Date  . BRCA negative 2007, 2012  . Breast cancer ([place]) 2007   chemotherapy/radiation- right breast/BRCA and my risk negative  . GERD (gastroesophageal reflux disease) 2012  . History of TMJ syndrome 2012  . HLD (hyperlipidemia)   . HTN (hypertension) 2011  . Migraines   . Personal history of chemotherapy   . Personal history of radiation therapy   . Personal history of tobacco use, presenting hazards to health   . Special screening for malignant neoplasms, colon   . Tachycardia     Past Surgical History:  Procedure Laterality Date  . ABDOMINAL HYSTERECTOMY    . BREAST BIOPSY Right 2007   - Inflammatory carcinoma of breast stage IIIB  . COLONOSCOPY  2009   [facility] Dr. Candace Cruise  . LAPAROSCOPIC TOTAL HYSTERECTOMY    . MASTECTOMY Right 12/2005   chemotherapy/radiation,# 2007- Inflammatory carcinoma of breast stage IIIB  . TONSILLECTOMY     as a child    Family History  Adopted: Yes  Problem Relation Age of Onset  . Breast cancer Mother   . Melanoma Mother        and brain tumor  . Lung cancer Maternal Aunt   . Melanoma Maternal Aunt     Social History Social History   Tobacco Use  . Smoking status: Former Smoker    Packs/day: 1.50    Years: 10.00    Pack  years: 15.00    Types: Cigarettes  . Smokeless tobacco: Never Used  Substance Use Topics  . Alcohol use: No  . Drug use: No    Allergies  Allergen Reactions  . Shrimp [Shellfish Allergy] Itching    Eyes burn Allergy to shrimp not sure of all shellfish    Current Outpatient Medications  Medication Sig Dispense Refill  . Cholecalciferol (VITAMIN D3) 1000 UNIT/SPRAY LIQD Take by mouth.     . Cyanocobalamin (VITAMIN B 12 PO) Take by mouth.    . EPINEPHrine (EPIPEN 2-PAK) 0.3 mg/0.3 mL IJ SOAJ injection     . exemestane (AROMASIN) 25 MG tablet Take 25 mg by mouth at bedtime.     . MULTIPLE VIT-MIN-CALCIUM-FA PO Take by mouth.    Marland Kitchen NASONEX 50 MCG/ACT nasal spray Place 1 spray into the nose daily.    . NON FORMULARY     . Omega-3 1000 MG CAPS Take by mouth.    . valACYclovir (VALTREX) 500 MG tablet as needed.      No current facility-administered medications for this visit.     Review of Systems Review of Systems  Constitutional: Negative.   Respiratory: Negative.   Cardiovascular: Negative.     Blood pressure 127/81, pulse 83, temperature 97.9 F (36.6 C),  temperature source Skin, resp. rate 12, height '5\' 7"'  (1.702 m), weight 161 lb (73 kg), SpO2 96 %.  Physical Exam Physical Exam  Constitutional: She is oriented to person, place, and time. She appears well-developed and well-nourished.  HENT:  Mouth/Throat: Oropharynx is clear and moist.  Eyes: Conjunctivae are normal. No scleral icterus.  Neck: Neck supple.  Cardiovascular: Normal rate, regular rhythm and normal heart sounds.  Pulmonary/Chest: Effort normal and breath sounds normal. Right breast exhibits no inverted nipple, no mass, no nipple discharge, no skin change and no tenderness. Left breast exhibits no inverted nipple, no mass, no nipple discharge, no skin change and no tenderness.  Right mastectomy site well healed.  Lymphadenopathy:    She has no cervical adenopathy.    She has no axillary adenopathy.   Neurological: She is alert and oriented to person, place, and time.  Skin: Skin is warm and dry.  Psychiatric: Her behavior is normal.    Data Reviewed Screening mammogram was completed through [facility] dated November 04, 2016 became available for review the day after the patient's visit.  The same focal density in the upper aspect of the breast was noted.  Ultrasound of the breast in the upper outer quadrant was negative.  Formal report from the November 17, 2017 left breast screening mammogram was reviewed when available today, BIRAD 1..  Assessment    No evidence of recurrent breast cancer.    Plan    Prior mammograms from [facility] will be forwarded to the hospital database.    Prescription for breast prosthesis and bras.   Patient will be asked to return to the office in one year with a bilateral screening mammogram.       HPI, Physical Exam, Assessment and Plan have been scribed under the direction and in the presence of Robert Bellow, MD. Karie Fetch, RN  I have completed the exam and reviewed the above documentation for accuracy and completeness.  I agree with the above.  Haematologist has been used and any errors in dictation or transcription are unintentional.  Hervey Ard, M.D., F.A.C.S.  Barbara Fleming 11/18/2017, 6:15 PM

## 2017-11-17 NOTE — Patient Instructions (Addendum)
The patient is aware to call back for any questions or new concerns. Patient will be asked to return to the office in one year with a bilateral screening mammogram.  

## 2017-11-20 ENCOUNTER — Inpatient Hospital Stay: Payer: Medicare Other

## 2017-11-20 VITALS — BP 127/85 | HR 80 | Temp 97.1°F | Resp 18

## 2017-11-20 DIAGNOSIS — Z79811 Long term (current) use of aromatase inhibitors: Secondary | ICD-10-CM

## 2017-11-20 DIAGNOSIS — C50811 Malignant neoplasm of overlapping sites of right female breast: Secondary | ICD-10-CM | POA: Diagnosis not present

## 2017-11-20 DIAGNOSIS — Z170 Estrogen receptor positive status [ER+]: Principal | ICD-10-CM

## 2017-11-20 MED ORDER — SODIUM CHLORIDE 0.9 % IV SOLN
Freq: Once | INTRAVENOUS | Status: AC
  Administered 2017-11-20: 09:00:00 via INTRAVENOUS
  Filled 2017-11-20: qty 250

## 2017-11-20 MED ORDER — ZOLEDRONIC ACID 4 MG/100ML IV SOLN
4.0000 mg | Freq: Once | INTRAVENOUS | Status: AC
  Administered 2017-11-20: 4 mg via INTRAVENOUS
  Filled 2017-11-20: qty 100

## 2017-11-24 ENCOUNTER — Telehealth: Payer: Self-pay | Admitting: Internal Medicine

## 2017-11-24 ENCOUNTER — Ambulatory Visit: Payer: Medicare Other

## 2017-11-24 NOTE — Telephone Encounter (Signed)
Mychart message sent.

## 2018-08-03 ENCOUNTER — Encounter: Payer: Self-pay | Admitting: Internal Medicine

## 2018-08-03 ENCOUNTER — Encounter: Payer: Self-pay | Admitting: General Surgery

## 2018-08-06 ENCOUNTER — Telehealth: Payer: Self-pay | Admitting: *Deleted

## 2018-08-06 MED ORDER — EXEMESTANE 25 MG PO TABS: 25.0000 mg | ORAL_TABLET | Freq: Every day | ORAL | 3 refills | Status: DC

## 2018-08-09 NOTE — Telephone Encounter (Signed)
Will fax script for 4 mastectomy bras and a breast prosthesis. Orders to be sent to [facility] per pt's request.

## 2018-09-27 ENCOUNTER — Other Ambulatory Visit: Payer: Self-pay

## 2018-09-27 DIAGNOSIS — Z1231 Encounter for screening mammogram for malignant neoplasm of breast: Secondary | ICD-10-CM

## 2018-10-01 ENCOUNTER — Other Ambulatory Visit: Payer: Self-pay

## 2018-10-01 DIAGNOSIS — Z1231 Encounter for screening mammogram for malignant neoplasm of breast: Secondary | ICD-10-CM

## 2018-10-01 NOTE — Addendum Note (Signed)
Addended by: Lesly Rubenstein on: 10/01/2018 12:41 PM   Modules accepted: Orders

## 2018-11-10 ENCOUNTER — Encounter: Payer: Self-pay | Admitting: Internal Medicine

## 2018-11-15 ENCOUNTER — Encounter: Payer: Self-pay | Admitting: Surgery

## 2018-11-16 ENCOUNTER — Other Ambulatory Visit: Payer: Self-pay

## 2018-11-16 ENCOUNTER — Telehealth: Payer: Self-pay

## 2018-11-16 ENCOUNTER — Encounter: Payer: Self-pay | Admitting: Internal Medicine

## 2018-11-16 NOTE — Telephone Encounter (Signed)
Patient sent message via MyChart message, stating she wanted to cancel her appointment for Dr.Piscoya on Friday 11/19/18. I responded to patient to inform her that if she has any questions or concerns to please feel free to contact our office.

## 2018-11-17 ENCOUNTER — Other Ambulatory Visit: Payer: Medicare Other

## 2018-11-17 ENCOUNTER — Inpatient Hospital Stay: Payer: Medicare Other | Attending: Internal Medicine | Admitting: Internal Medicine

## 2018-11-17 ENCOUNTER — Encounter: Payer: Self-pay | Admitting: Internal Medicine

## 2018-11-17 DIAGNOSIS — Z170 Estrogen receptor positive status [ER+]: Secondary | ICD-10-CM

## 2018-11-17 DIAGNOSIS — C50811 Malignant neoplasm of overlapping sites of right female breast: Secondary | ICD-10-CM | POA: Diagnosis not present

## 2018-11-17 NOTE — Progress Notes (Signed)
I connected with Barbara Fleming on 11/17/18 at 10:30 AM EST by video enabled telemedicine visit and verified that I am speaking with the correct person using two identifiers.  I discussed the limitations, risks, security and privacy concerns of performing an evaluation and management service by telemedicine and the availability of in-person appointments. I also discussed with the patient that there may be a patient responsible charge related to this service. The patient expressed understanding and agreed to proceed.    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  # TAH & BSO.   # Genetic testing- NEG   DIAGNOSIS: Right breast cancer  STAGE:  IIIB; GOALS: cure  CURRENT/MOST RECENT THERAPY : Extended aromasin    Carcinoma of overlapping sites of right breast in female, estrogen receptor positive ([place]   Chief Complaint: Breast cancer   History of present illness:Barbara Fleming 53y.o.  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  Patient continues to live in [place]  She works in rEngineer, petroleum  She has not been exposed to COVID-19.  She states that she has been being proactive in trying to avoid a Covid infection.  She states to have recent onset of back pain for which she had MRI that showed herniated disc.  No evidence of malignancy.   Observation/objective:  Assessment and plan: Carcinoma of overlapping sites of right breast in female, estrogen receptor positive ([place] # Inflammatory Breast cancer-stage IIIB;  ER PR positive HER-2/neu positive currently on Aromasin.  Clinically no evidence of recurrence.  Awaiting mammogram left side/PCP.  [place] normal limits.  #I would recommend indefinite Aromasin at this time as patient is tolerating Aromasin fairly well.  Patient will call for new prescriptions.  # Back pain-MRI as per patient shows disc herniation.  Defer to PCP for further evaluation.  #Adjuvant bisphosphonate- recommend Zometa 4 mg IV-adjuvant breast cancer/bone health on annual basis. [place] get [place]   #Genetic predisposition to malignancy-patient states to have BRCA testing-negative in 2007; however recommend evaluation with genetic counselor for an expanded testing/patient has 3 daughters.   # DISPOSITION: # referral to genetics counsellor/ DFW:YOVZCHcancer-VIDEO VISIT.  # follow up in 12 months-MD; labs-cbc/cmp/ca-27-29/MD/Zometa.- Dr.B  Cc: BPaul Half NP.   Follow-up instructions:  I discussed the assessment and treatment plan with the patient.  The patient was provided an opportunity to ask questions and all were answered.  The patient agreed with the plan and demonstrated understanding of instructions.  The patient was advised to call back or seek an in person evaluation if the symptoms worsen or if the condition fails to improve as anticipated.  [facility]. [facility]11/04/2018 1:51 PM

## 2018-11-17 NOTE — Assessment & Plan Note (Addendum)
#  Inflammatory Breast cancer-stage IIIB; ER PR positive HER-2/neu positive currently on Aromasin.  Clinically no evidence of recurrence.  Awaiting mammogram left side/PCP.  October 2020/[place] tumor marker within normal limits.  #I would recommend indefinite Aromasin at this time as patient is tolerating Aromasin fairly well.  Patient will call for new prescriptions.  # Back pain-MRI as per patient shows disc herniation.  Defer to PCP for further evaluation.  #Adjuvant bisphosphonate- recommend Zometa 4 mg IV-adjuvant breast cancer/bone health on annual basis. Will get with her PCP in [place].   #Genetic predisposition to malignancy-patient states to have BRCA testing-negative in 2007; however recommend evaluation with genetic counselor for an expanded testing/patient has 3 daughters.   # DISPOSITION: # referral to genetics counsellor/ GX:QJJHER cancer-VIDEO VISIT.  # follow up in 12 months-MD; labs-cbc/cmp/ca-27-29/MD/Zometa.- Dr.B  Cc: Paul Half, NP.

## 2018-11-19 ENCOUNTER — Ambulatory Visit: Payer: Medicare Other | Admitting: Surgery

## 2018-11-22 ENCOUNTER — Telehealth: Payer: Self-pay | Admitting: Internal Medicine

## 2018-11-22 ENCOUNTER — Telehealth: Payer: Self-pay | Admitting: *Deleted

## 2018-11-22 NOTE — Telephone Encounter (Signed)
Dr. Rogue Bussing, pt's pcp off Paul Half, [place] called and left a vm to clarify dosing of zometa.  Do you want 4 mgs every 6 months or 5 mg every 12 months.  Please return her phone call at [phone]

## 2018-11-22 NOTE — Telephone Encounter (Signed)
Left message for PT about genetic testing-ltg

## 2018-11-22 NOTE — Telephone Encounter (Signed)
I will call pt's PCP in the afetrnoon.  GB

## 2018-11-23 ENCOUNTER — Telehealth: Payer: Self-pay | Admitting: Internal Medicine

## 2018-11-23 NOTE — Telephone Encounter (Signed)
Recommend zometa 4mg  IV every 6 months- 3-5 years.   Called pcp, Paul Half, DNP office closed; will again.

## 2018-12-06 ENCOUNTER — Encounter: Payer: Self-pay | Admitting: Internal Medicine

## 2019-02-09 ENCOUNTER — Ambulatory Visit: Payer: Medicare Other | Admitting: Genetic Counselor

## 2019-02-09 NOTE — Progress Notes (Signed)
Patient is located in [place]. [place] has Chartered certified accountant.  I do not have a [place] license, so I referred her to a genetic counselor who would be able to see her.

## 2019-09-30 ENCOUNTER — Other Ambulatory Visit: Payer: Self-pay | Admitting: Internal Medicine

## 2019-11-13 ENCOUNTER — Other Ambulatory Visit: Payer: Self-pay | Admitting: Internal Medicine

## 2019-11-14 ENCOUNTER — Encounter: Payer: Self-pay | Admitting: Internal Medicine

## 2019-11-17 ENCOUNTER — Inpatient Hospital Stay: Payer: Medicare Other

## 2019-11-17 ENCOUNTER — Inpatient Hospital Stay: Payer: Medicare Other | Admitting: Internal Medicine

## 2019-11-17 ENCOUNTER — Telehealth: Payer: Self-pay | Admitting: *Deleted

## 2019-11-17 NOTE — Telephone Encounter (Signed)
Pt called to cx her 11/17/19 lab/MD/Zometa appts she stated that she was [place] and would call back to have appt R/S for a later date.. 11/17/19 appts were cx per pt request.

## 2021-11-27 DIAGNOSIS — Z853 Personal history of malignant neoplasm of breast: Secondary | ICD-10-CM

## 2021-11-27 NOTE — Progress Notes (Signed)
Survivorship Care Plan visit completed.  Treatment summary reviewed and given to patient via MyChart.  ASCO answers booklet reviewed and mailed to patient.

## 2022-12-01 ENCOUNTER — Encounter: Payer: Self-pay | Admitting: Internal Medicine

## 2022-12-03 ENCOUNTER — Inpatient Hospital Stay: Payer: BLUE CROSS/BLUE SHIELD | Attending: Internal Medicine | Admitting: Occupational Therapy

## 2022-12-03 ENCOUNTER — Encounter: Payer: Self-pay | Admitting: Internal Medicine

## 2022-12-03 DIAGNOSIS — Z9011 Acquired absence of right breast and nipple: Secondary | ICD-10-CM

## 2022-12-03 NOTE — Therapy (Signed)
[facility] - [facility] Dept Of [place]. [facility] [address] [place], [place], [address] Phone: [phone]   Fax:  [phone]  Occupational Therapy Screen  Patient Details  Name: Barbara Fleming MRN: [id number] Date of Birth: April 22, 1965 No data recorded  Encounter Date: 12/03/2022   OT End of Session - 12/03/22 1139     Visit Number 0             Past Medical History:  Diagnosis Date   BRCA negative 2007, 2012   Breast cancer (H[facility]) 2007   chemotherapy/radiation- right breast/BRCA and my risk negative   GERD (gastroesophageal reflux disease) 2012   History of TMJ syndrome 2012   HLD (hyperlipidemia)    HTN (hypertension) 2011   Lumbar herniated disc    Migraines    Personal history of chemotherapy    Personal history of radiation therapy    Personal history of tobacco use, presenting hazards to health    Special screening for malignant neoplasms, colon    Tachycardia     Past Surgical History:  Procedure Laterality Date   ABDOMINAL HYSTERECTOMY     BREAST BIOPSY Right 2007   - Inflammatory carcinoma of breast stage IIIB   COLONOSCOPY  2009   [facility] Dr. Bluford Kaufmann   LAPAROSCOPIC TOTAL HYSTERECTOMY     MASTECTOMY Right 12/2005   chemotherapy/radiation,# 2007- Inflammatory carcinoma of breast stage IIIB   TONSILLECTOMY     as a child    There were no vitals filed for this visit.        LYMPHEDEMA/ONCOLOGY QUESTIONNAIRE - 12/03/22 0001       Right Upper Extremity Lymphedema   At Axilla  34.7 cm    15 cm Proximal to Olecranon Process 31 cm    10 cm Proximal to Olecranon Process 29 cm    Olecranon Process 24.4 cm    15 cm Proximal to Ulnar Styloid Process 25 cm    10 cm Proximal to Ulnar Styloid Process 21.5 cm    Just Proximal to Ulnar Styloid Process 15.4 cm    Across Hand at [facility] 19 cm      Left Upper Extremity Lymphedema   At Axilla  34 cm    15 cm Proximal to Olecranon Process 31 cm     10 cm Proximal to Olecranon Process 28.5 cm    Olecranon Process 25 cm    15 cm Proximal to Ulnar Styloid Process 24.7 cm    10 cm Proximal to Ulnar Styloid Process 20.5 cm    Just Proximal to Ulnar Styloid Process 15.4 cm    Across Hand at [facility] 18.4 cm             06/05/22 NOTE MD: History of present illness:  Barbara Fleming is a 57 y.o. caucasian, post menopausal female, referred to the breast surgery clinic to establish care. 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 Her pathology was estrogen receptor positive, progesterone receptor positive, and HER2 positive. Patient underwent neoadjuvant chemotherapy followed by a right breast modified radical mastectomy on 12.7.07. She was found to have 1 node out of 17 positive on her surgical pathology. She completed adjuvant therapy with 1 year of Herceptin and continued on tamoxifen until 2012. She then transition to exemestane following that and remained on this until roughly 2 months ago (in  2024).  The patient did not proceed with any type of reconstruction until this point. She was recently seen by prosthetics and was fitted.  Her last mammogram of her left breast was completed in December 2023 with no abnormalities identified.  The patient states currently she is doing well. She denies any palpable changes to her right chest wall or left breast. She does have 1 area on the outer portion of the left breast which she states she intermittently palpates and is aware of but nothing consistent. She does have some intermittent tenderness to her right chest wall and also intermittent tenderness to the left breast area. She has had no additional symptoms to report.  She did previously undergo genetic testing at diagnosis which was updated again in February 2016. This was through [facility] which was negative for any  pathogenic mutation.  The family history is notable for breast cancer in her mother, as well as melanoma in her mother and paternal aunt.    OT SCREEN 12/03/22:  Patient report since 2007 wearing a preventative sleeve on right upper extremity 25 to 50% of the day.  With high risk activities.  As well as flying.  This far did not use a glove. Patient had 17 lymph nodes removed on right side of which 1 was positive. Had a radical mastectomy. Patient do report symptoms of stage I lymphedema mostly appear to be in right thoracic posterior axilla when driving or going into higher altitudes or high risk activity. Did recommend for patient to get a unilateral postmastectomy Jovi pack to use as needed to decrease thoracic lymphedema and facilitate increased lymphatic decongestion - wear under light compression bra or camisole. Patient's bilateral circumference measurements within normal limits compared to each other.  Patient is right-hand dominant. Discussed with patient some conditioning for core and upper body.    Possible Pilates with a PT closer to home. Remind patient again to replace her sleeve annually.  Patient to follow-up as needed.                                 Visit Diagnosis: H/O right mastectomy    Problem List Patient Active Problem List   Diagnosis Date Noted   Carcinoma of overlapping sites of right breast in female, estrogen receptor positive (HCC) 11/16/2017   Aromatase inhibitor use 11/16/2017   Cancer (HCC) 09/18/2014   Essential (primary) hypertension 09/18/2014   Combined fat and carbohydrate induced hyperlipemia 09/18/2014   Acid reflux 09/18/2014   Cervical pain 09/18/2014   Personal history of malignant neoplasm of breast    Syncope 05/11/2012   Syncope and collapse 05/11/2012   HTN (hypertension) 04/03/2011   Tachycardia 04/03/2011   Hyperlipidemia 04/03/2011   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 [facility] [address], [address] [place], [place], [address] Phone: [phone]   Fax:  [phone]  Name: Barbara Fleming MRN: [id number] Date of Birth: 18-May-1965
